# Patient Record
Sex: Female | Born: 1985 | Race: White | Hispanic: No | Marital: Married | Smoking: Never smoker
Health system: Southern US, Community
[De-identification: ages and names within clinical notes are randomized; demographics above are authoritative.]

## PROBLEM LIST (undated history)

## (undated) ENCOUNTER — Inpatient Hospital Stay (HOSPITAL_COMMUNITY): Payer: Self-pay

## (undated) DIAGNOSIS — O139 Gestational [pregnancy-induced] hypertension without significant proteinuria, unspecified trimester: Secondary | ICD-10-CM

## (undated) HISTORY — PX: TONSILLECTOMY: SUR1361

## (undated) HISTORY — PX: WISDOM TOOTH EXTRACTION: SHX21

## (undated) HISTORY — PX: BUNIONECTOMY: SHX129

## (undated) HISTORY — DX: Gestational (pregnancy-induced) hypertension without significant proteinuria, unspecified trimester: O13.9

---
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
## 2015-06-24 LAB — OB RESULTS CONSOLE HIV ANTIBODY (ROUTINE TESTING): HIV: NONREACTIVE

## 2015-06-24 LAB — OB RESULTS CONSOLE RPR: RPR: NONREACTIVE

## 2015-06-24 LAB — OB RESULTS CONSOLE GC/CHLAMYDIA
Chlamydia: NEGATIVE
Gonorrhea: NEGATIVE

## 2015-06-24 LAB — OB RESULTS CONSOLE ABO/RH: RH TYPE: POSITIVE

## 2015-06-24 LAB — OB RESULTS CONSOLE HEPATITIS B SURFACE ANTIGEN: Hepatitis B Surface Ag: NEGATIVE

## 2015-06-24 LAB — OB RESULTS CONSOLE RUBELLA ANTIBODY, IGM: Rubella: IMMUNE

## 2015-06-24 LAB — OB RESULTS CONSOLE ANTIBODY SCREEN: Antibody Screen: NEGATIVE

## 2015-12-31 LAB — OB RESULTS CONSOLE GBS: GBS: POSITIVE

## 2016-01-28 ENCOUNTER — Telehealth (HOSPITAL_COMMUNITY): Payer: Self-pay | Admitting: *Deleted

## 2016-01-28 ENCOUNTER — Encounter (HOSPITAL_COMMUNITY): Payer: Self-pay | Admitting: *Deleted

## 2016-01-28 NOTE — Telephone Encounter (Signed)
Preadmission screen  

## 2016-01-30 ENCOUNTER — Inpatient Hospital Stay (HOSPITAL_COMMUNITY): Payer: Self-pay

## 2016-01-31 NOTE — H&P (Signed)
Samantha Rogers is a 30 y.o. female presenting for post dates IOL. U/S in office on 01/27/16 noted EFW 7# 15 oz, vtx. History OB History    Gravida Para Term Preterm AB TAB SAB Ectopic Multiple Living   1              No past medical history on file. Past Surgical History  Procedure Laterality Date  . Tonsillectomy    . Wisdom tooth extraction     Family History: family history includes Bipolar disorder in her mother; Heart disease in her maternal grandmother; Hypertension in her father; Stroke in her maternal grandfather and paternal grandfather. Social History:  has no tobacco, alcohol, and drug history on file.   Prenatal Transfer Tool  Maternal Diabetes: No Genetic Screening: Normal Maternal Ultrasounds/Referrals: Normal Fetal Ultrasounds or other Referrals:  None Maternal Substance Abuse:  No Significant Maternal Medications:  None Significant Maternal Lab Results:  None Other Comments:  None  Review of Systems  Eyes: Negative for blurred vision.  Gastrointestinal: Negative for abdominal pain.  Neurological: Negative for headaches.      Last menstrual period 04/13/2015. Maternal Exam:  Abdomen: Fetal presentation: vertex     Physical Exam  Cardiovascular: Normal rate.   Respiratory: Effort normal.  GI: Soft.  Neurological: She has normal reflexes.    Prenatal labs: ABO, Rh: A/Positive/-- (11/21 0000) Antibody: Negative (11/21 0000) Rubella: Immune (11/21 0000) RPR: Nonreactive (11/21 0000)  HBsAg: Negative (11/21 0000)  HIV: Non-reactive (11/21 0000)  GBS: Positive (05/30 0000)   Assessment/Plan: 30 yo G1P0 @ [redacted] wks EGA for 2 stage induction Risks reviewed with patient ATB for GBBS prophylaxis   Emir Nack II,Junette Bernat E 01/31/2016, 1:11 PM

## 2016-02-03 ENCOUNTER — Inpatient Hospital Stay (HOSPITAL_COMMUNITY)
Admission: RE | Admit: 2016-02-03 | Discharge: 2016-02-03 | Disposition: A | Discharge status: Home / Self Care | Payer: BLUE CROSS/BLUE SHIELD | Source: Ambulatory Visit | Attending: Obstetrics and Gynecology | Admitting: Obstetrics and Gynecology

## 2016-02-03 ENCOUNTER — Inpatient Hospital Stay (HOSPITAL_COMMUNITY)
Admission: AD | Admit: 2016-02-03 | Discharge: 2016-02-06 | DRG: 775 | Disposition: A | Discharge status: Home / Self Care | Payer: BLUE CROSS/BLUE SHIELD | Source: Ambulatory Visit | Attending: Obstetrics and Gynecology | Admitting: Obstetrics and Gynecology

## 2016-02-03 ENCOUNTER — Inpatient Hospital Stay (HOSPITAL_COMMUNITY): Payer: BLUE CROSS/BLUE SHIELD | Admitting: Anesthesiology

## 2016-02-03 ENCOUNTER — Encounter (HOSPITAL_COMMUNITY): Payer: Self-pay

## 2016-02-03 DIAGNOSIS — O480 Post-term pregnancy: Secondary | ICD-10-CM | POA: Diagnosis present

## 2016-02-03 DIAGNOSIS — Z3A41 41 weeks gestation of pregnancy: Secondary | ICD-10-CM

## 2016-02-03 DIAGNOSIS — O1494 Unspecified pre-eclampsia, complicating childbirth: Secondary | ICD-10-CM | POA: Diagnosis present

## 2016-02-03 DIAGNOSIS — R51 Headache: Secondary | ICD-10-CM | POA: Diagnosis present

## 2016-02-03 LAB — CBC
HCT: 38.3 % (ref 36.0–46.0)
HEMATOCRIT: 37.5 % (ref 36.0–46.0)
Hemoglobin: 13.3 g/dL (ref 12.0–15.0)
Hemoglobin: 13.2 g/dL (ref 12.0–15.0)
MCH: 30.9 pg (ref 26.0–34.0)
MCH: 31.2 pg (ref 26.0–34.0)
MCHC: 34.7 g/dL (ref 30.0–36.0)
MCHC: 35.2 g/dL (ref 30.0–36.0)
MCV: 89.1 fL (ref 78.0–100.0)
MCV: 88.7 fL (ref 78.0–100.0)
PLATELETS: 159 10*3/uL (ref 150–400)
PLATELETS: 166 10*3/uL (ref 150–400)
RBC: 4.3 MIL/uL (ref 3.87–5.11)
RBC: 4.23 MIL/uL (ref 3.87–5.11)
RDW: 13.4 % (ref 11.5–15.5)
RDW: 13.2 % (ref 11.5–15.5)
WBC: 6.8 10*3/uL (ref 4.0–10.5)
WBC: 10.8 10*3/uL — AB (ref 4.0–10.5)

## 2016-02-03 LAB — COMPREHENSIVE METABOLIC PANEL
ALK PHOS: 161 U/L — AB (ref 38–126)
ALT: 22 U/L (ref 14–54)
AST: 25 U/L (ref 15–41)
Albumin: 2.9 g/dL — ABNORMAL LOW (ref 3.5–5.0)
Anion gap: 8 (ref 5–15)
BUN: 11 mg/dL (ref 6–20)
CALCIUM: 9.3 mg/dL (ref 8.9–10.3)
CO2: 20 mmol/L — AB (ref 22–32)
CREATININE: 0.7 mg/dL (ref 0.44–1.00)
Chloride: 106 mmol/L (ref 101–111)
GFR calc non Af Amer: >60 mL/min (ref >60)
GLUCOSE: 110 mg/dL — AB (ref 65–99)
Potassium: 3.9 mmol/L (ref 3.5–5.1)
SODIUM: 134 mmol/L — AB (ref 135–145)
Total Bilirubin: 0.3 mg/dL (ref 0.3–1.2)
Total Protein: 5.2 g/dL — ABNORMAL LOW (ref 6.5–8.1)

## 2016-02-03 LAB — URIC ACID: Uric Acid, Serum: 6.5 mg/dL (ref 2.3–6.6)

## 2016-02-03 LAB — TYPE AND SCREEN
ABO/RH(D): A POS
Antibody Screen: NEGATIVE

## 2016-02-03 LAB — ABO/RH: ABO/RH(D): A POS

## 2016-02-03 LAB — OB RESULTS CONSOLE GBS: GBS: POSITIVE

## 2016-02-03 LAB — OB RESULTS CONSOLE GC/CHLAMYDIA: Chlamydia: NEGATIVE

## 2016-02-03 MED ORDER — LACTATED RINGERS IV SOLN: 500.0000 mL | INTRAVENOUS | Status: DC | PRN

## 2016-02-03 MED ORDER — ZOLPIDEM TARTRATE 5 MG PO TABS: 5.0000 mg | ORAL_TABLET | Freq: Every evening | ORAL | Status: DC | PRN

## 2016-02-03 MED ORDER — BUTORPHANOL TARTRATE 1 MG/ML IJ SOLN: 1.0000 mg | INTRAMUSCULAR | Status: DC | PRN

## 2016-02-03 MED ORDER — MAGNESIUM SULFATE BOLUS VIA INFUSION
4.0000 g | Freq: Once | INTRAVENOUS | Status: AC
  Administered 2016-02-03: 4 g via INTRAVENOUS
  Filled 2016-02-03: qty 500

## 2016-02-03 MED ORDER — ONDANSETRON HCL 4 MG/2ML IJ SOLN
4.0000 mg | Freq: Four times a day (QID) | INTRAMUSCULAR | Status: DC | PRN
  Administered 2016-02-03: 4 mg via INTRAVENOUS
  Filled 2016-02-03: qty 2

## 2016-02-03 MED ORDER — LIDOCAINE HCL (PF) 1 % IJ SOLN
30.0000 mL | INTRAMUSCULAR | Status: AC | PRN
  Administered 2016-02-04: 30 mL via SUBCUTANEOUS
  Filled 2016-02-03: qty 30

## 2016-02-03 MED ORDER — FLEET ENEMA 7-19 GM/118ML RE ENEM
1.0000 | ENEMA | RECTAL | Status: DC | PRN
Start: 2016-02-03 — End: 2016-02-04

## 2016-02-03 MED ORDER — PHENYLEPHRINE 40 MCG/ML (10ML) SYRINGE FOR IV PUSH (FOR BLOOD PRESSURE SUPPORT)
80.0000 ug | PREFILLED_SYRINGE | INTRAVENOUS | Status: DC | PRN
  Filled 2016-02-03: qty 5
  Filled 2016-02-03: qty 10

## 2016-02-03 MED ORDER — DEXTROSE 5 % IV SOLN
5.0000 10*6.[IU] | Freq: Once | INTRAVENOUS | Status: AC
  Administered 2016-02-03: 5 10*6.[IU] via INTRAVENOUS
  Filled 2016-02-03: qty 5

## 2016-02-03 MED ORDER — SOD CITRATE-CITRIC ACID 500-334 MG/5ML PO SOLN: 30.0000 mL | ORAL | Status: DC | PRN

## 2016-02-03 MED ORDER — FENTANYL 2.5 MCG/ML BUPIVACAINE 1/10 % EPIDURAL INFUSION (WH - ANES)
14.0000 mL/h | INTRAMUSCULAR | Status: DC | PRN
  Administered 2016-02-03 (×2): 14 mL/h via EPIDURAL
  Filled 2016-02-03: qty 125

## 2016-02-03 MED ORDER — EPHEDRINE 5 MG/ML INJ
10.0000 mg | INTRAVENOUS | Status: DC | PRN
  Filled 2016-02-03: qty 2

## 2016-02-03 MED ORDER — LACTATED RINGERS IV SOLN
500.0000 mL | Freq: Once | INTRAVENOUS | Status: DC
Start: 2016-02-03 — End: 2016-02-04

## 2016-02-03 MED ORDER — OXYCODONE-ACETAMINOPHEN 5-325 MG PO TABS: 2.0000 | ORAL_TABLET | ORAL | Status: DC | PRN

## 2016-02-03 MED ORDER — OXYCODONE-ACETAMINOPHEN 5-325 MG PO TABS: 1.0000 | ORAL_TABLET | ORAL | Status: DC | PRN

## 2016-02-03 MED ORDER — ACETAMINOPHEN 325 MG PO TABS: 650.0000 mg | ORAL_TABLET | ORAL | Status: DC | PRN

## 2016-02-03 MED ORDER — MAGNESIUM SULFATE 50 % IJ SOLN
2.0000 g/h | INTRAVENOUS | Status: DC
  Filled 2016-02-03: qty 80

## 2016-02-03 MED ORDER — LIDOCAINE HCL (PF) 1 % IJ SOLN
INTRAMUSCULAR | Status: DC | PRN
  Administered 2016-02-03 (×2): 5 mL

## 2016-02-03 MED ORDER — PENICILLIN G POTASSIUM 5000000 UNITS IJ SOLR
2.5000 10*6.[IU] | INTRAVENOUS | Status: DC
  Administered 2016-02-03 – 2016-02-04 (×3): 2.5 10*6.[IU] via INTRAVENOUS
  Filled 2016-02-03 (×7): qty 2.5

## 2016-02-03 MED ORDER — TERBUTALINE SULFATE 1 MG/ML IJ SOLN: 0.2500 mg | Freq: Once | INTRAMUSCULAR | Status: DC | PRN

## 2016-02-03 MED ORDER — LACTATED RINGERS IV SOLN
INTRAVENOUS | Status: DC
  Administered 2016-02-03: 100 mL/h via INTRAVENOUS

## 2016-02-03 MED ORDER — DIPHENHYDRAMINE HCL 50 MG/ML IJ SOLN: 12.5000 mg | INTRAMUSCULAR | Status: DC | PRN

## 2016-02-03 MED ORDER — MISOPROSTOL 25 MCG QUARTER TABLET
25.0000 ug | ORAL_TABLET | ORAL | Status: DC | PRN
  Administered 2016-02-03 (×2): 25 ug via VAGINAL
  Filled 2016-02-03 (×3): qty 0.25
  Filled 2016-02-03: qty 1

## 2016-02-03 MED ORDER — OXYTOCIN 40 UNITS IN LACTATED RINGERS INFUSION - SIMPLE MED
2.5000 [IU]/h | INTRAVENOUS | Status: DC
  Filled 2016-02-03: qty 1000

## 2016-02-03 MED ORDER — PHENYLEPHRINE 40 MCG/ML (10ML) SYRINGE FOR IV PUSH (FOR BLOOD PRESSURE SUPPORT)
80.0000 ug | PREFILLED_SYRINGE | INTRAVENOUS | Status: DC | PRN
  Filled 2016-02-03: qty 5

## 2016-02-03 MED ORDER — OXYTOCIN BOLUS FROM INFUSION
500.0000 mL | INTRAVENOUS | Status: DC
  Administered 2016-02-04: 500 mL via INTRAVENOUS

## 2016-02-03 NOTE — Anesthesia Preprocedure Evaluation (Signed)
Anesthesia Evaluation  Patient identified by MRN, date of birth, ID band Patient awake    Reviewed: Allergy & Precautions, H&P , NPO status , Patient's Chart, lab work & pertinent test results  History of Anesthesia Complications Negative for: history of anesthetic complications  Airway Mallampati: II  TM Distance: >3 FB Neck ROM: full    Dental no notable dental hx. (+) Teeth Intact   Pulmonary neg pulmonary ROS,    Pulmonary exam normal breath sounds clear to auscultation       Cardiovascular hypertension (on magnesium), Normal cardiovascular exam Rhythm:regular Rate:Normal     Neuro/Psych negative neurological ROS  negative psych ROS   GI/Hepatic negative GI ROS, Neg liver ROS,   Endo/Other  negative endocrine ROS  Renal/GU negative Renal ROS  negative genitourinary   Musculoskeletal negative musculoskeletal ROS (+)   Abdominal   Peds negative pediatric ROS (+)  Hematology negative hematology ROS (+)   Anesthesia Other Findings   Reproductive/Obstetrics (+) Pregnancy                             Anesthesia Physical Anesthesia Plan  ASA: II  Anesthesia Plan: Epidural   Post-op Pain Management:    Induction:   Airway Management Planned:   Additional Equipment:   Intra-op Plan:   Post-operative Plan:   Informed Consent: I have reviewed the patients History and Physical, chart, labs and discussed the procedure including the risks, benefits and alternatives for the proposed anesthesia with the patient or authorized representative who has indicated his/her understanding and acceptance.     Plan Discussed with:   Anesthesia Plan Comments:         Anesthesia Quick Evaluation

## 2016-02-03 NOTE — Anesthesia Pain Management Evaluation Note (Signed)
  CRNA Pain Management Visit Note  Patient: Samantha Rogers, 30 y.o., female  "Hello I am a member of the anesthesia team at [facility]. We have an anesthesia team available at all times to provide care throughout the hospital, including epidural management and anesthesia for C-section. I don't know your plan for the delivery whether it a natural birth, water birth, IV sedation, nitrous supplementation, doula or epidural, but we want to meet your pain goals."   1.Was your pain managed to your expectations on prior hospitalizations?     2.What is your expectation for pain management during this hospitalization?      3.How can we help you reach that goal?   Record the patient's initial score and the patient's pain goal.   Pain: 0  Pain Goal: 5 [facility] wants you to be able to say your pain was always managed very well.  Samantha Rogers,Samantha Rogers 02/03/2016

## 2016-02-03 NOTE — Progress Notes (Signed)
Patient decided to not do induction this am. Instead, she presented to office with C/O mild HA this am. BP in office 150/100, protein=negative. No HA now. Sent to L&D for two stage induction.  Filed Vitals:   02/03/16 1326 02/03/16 1332  BP: 133/88 141/91  Pulse: 90 86  Temp:    Resp: 16 16   No epigastric tenderness DTR in office 2+ without clonus  FHT cat one  Results for orders placed or performed during the hospital encounter of 02/03/16 (from the past 24 hour(s))  Comprehensive metabolic panel     Status: Abnormal   Collection Time: 02/03/16 12:50 PM  Result Value Ref Range   Sodium 134 (L) 135 - 145 mmol/L   Potassium 3.9 3.5 - 5.1 mmol/L   Chloride 106 101 - 111 mmol/L   CO2 20 (L) 22 - 32 mmol/L   Glucose, Bld 110 (H) 65 - 99 mg/dL   BUN 11 6 - 20 mg/dL   Creatinine, Ser 1.190.70 0.44 - 1.00 mg/dL   Calcium 9.3 8.9 - 14.710.3 mg/dL   Total Protein 5.2 (L) 6.5 - 8.1 g/dL   Albumin 2.9 (L) 3.5 - 5.0 g/dL   AST 25 15 - 41 U/L   ALT 22 14 - 54 U/L   Alkaline Phosphatase 161 (H) 38 - 126 U/L   Total Bilirubin 0.3 0.3 - 1.2 mg/dL   GFR calc non Af Amer >60 >60 mL/min   GFR calc Af Amer >60 >60 mL/min   Anion gap 8 5 - 15  Uric acid     Status: None   Collection Time: 02/03/16 12:50 PM  Result Value Ref Range   Uric Acid, Serum 6.5 2.3 - 6.6 mg/dL  CBC     Status: None   Collection Time: 02/03/16 12:50 PM  Result Value Ref Range   WBC 6.8 4.0 - 10.5 K/uL   RBC 4.30 3.87 - 5.11 MIL/uL   Hemoglobin 13.3 12.0 - 15.0 g/dL   HCT 82.938.3 56.236.0 - 13.046.0 %   MCV 89.1 78.0 - 100.0 fL   MCH 30.9 26.0 - 34.0 pg   MCHC 34.7 30.0 - 36.0 g/dL   RDW 86.513.4 78.411.5 - 69.615.5 %   Platelets 159 150 - 400 K/uL  Type and [facility] [place]     Status: None   Collection Time: 02/03/16 12:50 PM  Result Value Ref Range   ABO/RH(D) A POS    Antibody Screen NEG    Sample Expiration 02/06/2016    A/P: preeclampsia         D/W patient and husband-magnesium sulfate for sz prophylaxis,  two stage induction-risks reviewed         Patient states she understands and agrees         Repeat labs in am

## 2016-02-03 NOTE — Anesthesia Procedure Notes (Signed)
Epidural Patient location during procedure: OB  Staffing Anesthesiologist: Marshel Golubski Performed by: anesthesiologist   Preanesthetic Checklist Completed: patient identified, site marked, surgical consent, pre-op evaluation, timeout performed, IV checked, risks and benefits discussed and monitors and equipment checked  Epidural Patient position: sitting Prep: DuraPrep Patient monitoring: heart rate, continuous pulse ox and blood pressure Approach: right paramedian Location: L3-L4 Injection technique: LOR saline  Needle:  Needle type: Tuohy  Needle gauge: 17 G Needle length: 9 cm and 9 Needle insertion depth: 6 cm Catheter type: closed end flexible Catheter size: 20 Guage Catheter at skin depth: 10 cm Test dose: negative  Assessment Events: blood not aspirated, injection not painful, no injection resistance, negative IV test and no paresthesia  Additional Notes Patient identified. Risks/Benefits/Options discussed with patient including but not limited to bleeding, infection, nerve damage, paralysis, failed block, incomplete pain control, headache, blood pressure changes, nausea, vomiting, reactions to medication both or allergic, itching and postpartum back pain. Confirmed with bedside nurse the patient's most recent platelet count. Confirmed with patient that they are not currently taking any anticoagulation, have any bleeding history or any family history of bleeding disorders. Patient expressed understanding and wished to proceed. All questions were answered. Sterile technique was used throughout the entire procedure. Please see nursing notes for vital signs. Test dose was given through epidural needle and negative prior to continuing to dose epidural or start infusion. Warning signs of high block given to the patient including shortness of breath, tingling/numbness in hands, complete motor block, or any concerning symptoms with instructions to call for help. Patient was given  instructions on fall risk and not to get out of bed. All questions and concerns addressed with instructions to call with any issues.   

## 2016-02-04 ENCOUNTER — Encounter (HOSPITAL_COMMUNITY): Payer: Self-pay

## 2016-02-04 LAB — COMPREHENSIVE METABOLIC PANEL
ALK PHOS: 157 U/L — AB (ref 38–126)
ALT: 30 U/L (ref 14–54)
AST: 43 U/L — AB (ref 15–41)
Albumin: 2.8 g/dL — ABNORMAL LOW (ref 3.5–5.0)
Anion gap: 9 (ref 5–15)
BILIRUBIN TOTAL: 0.4 mg/dL (ref 0.3–1.2)
BUN: 9 mg/dL (ref 6–20)
CALCIUM: 7.8 mg/dL — AB (ref 8.9–10.3)
CHLORIDE: 104 mmol/L (ref 101–111)
CO2: 19 mmol/L — ABNORMAL LOW (ref 22–32)
CREATININE: 0.85 mg/dL (ref 0.44–1.00)
Glucose, Bld: 99 mg/dL (ref 65–99)
Potassium: 4.2 mmol/L (ref 3.5–5.1)
Sodium: 132 mmol/L — ABNORMAL LOW (ref 135–145)
TOTAL PROTEIN: 5.5 g/dL — AB (ref 6.5–8.1)

## 2016-02-04 LAB — CBC
HEMATOCRIT: 39.6 % (ref 36.0–46.0)
Hemoglobin: 14 g/dL (ref 12.0–15.0)
MCH: 31.1 pg (ref 26.0–34.0)
MCHC: 35.4 g/dL (ref 30.0–36.0)
MCV: 88 fL (ref 78.0–100.0)
PLATELETS: 174 10*3/uL (ref 150–400)
RBC: 4.5 MIL/uL (ref 3.87–5.11)
RDW: 13.1 % (ref 11.5–15.5)
WBC: 16 10*3/uL — AB (ref 4.0–10.5)

## 2016-02-04 LAB — URIC ACID: URIC ACID, SERUM: 6.3 mg/dL (ref 2.3–6.6)

## 2016-02-04 LAB — RPR: RPR Ser Ql: NONREACTIVE

## 2016-02-04 MED ORDER — DIPHENHYDRAMINE HCL 25 MG PO CAPS: 25.0000 mg | ORAL_CAPSULE | Freq: Four times a day (QID) | ORAL | Status: DC | PRN

## 2016-02-04 MED ORDER — TETANUS-DIPHTH-ACELL PERTUSSIS 5-2.5-18.5 LF-MCG/0.5 IM SUSP
0.5000 mL | Freq: Once | INTRAMUSCULAR | Status: DC
  Filled 2016-02-04: qty 0.5

## 2016-02-04 MED ORDER — SENNOSIDES-DOCUSATE SODIUM 8.6-50 MG PO TABS
2.0000 | ORAL_TABLET | ORAL | Status: DC
  Administered 2016-02-05: 1 via ORAL
  Administered 2016-02-05: 2 via ORAL
  Filled 2016-02-04 (×2): qty 2

## 2016-02-04 MED ORDER — DIBUCAINE 1 % RE OINT: 1.0000 "application " | TOPICAL_OINTMENT | RECTAL | Status: DC | PRN

## 2016-02-04 MED ORDER — PRENATAL MULTIVITAMIN CH
1.0000 | ORAL_TABLET | Freq: Every day | ORAL | Status: DC
  Administered 2016-02-04 – 2016-02-06 (×2): 1 via ORAL
  Filled 2016-02-04 (×3): qty 1

## 2016-02-04 MED ORDER — OXYCODONE HCL 5 MG PO TABS: 10.0000 mg | ORAL_TABLET | ORAL | Status: DC | PRN

## 2016-02-04 MED ORDER — MAGNESIUM HYDROXIDE 400 MG/5ML PO SUSP: 30.0000 mL | ORAL | Status: DC | PRN

## 2016-02-04 MED ORDER — MAGNESIUM SULFATE 50 % IJ SOLN
2.0000 g/h | INTRAVENOUS | Status: DC
  Filled 2016-02-04: qty 80

## 2016-02-04 MED ORDER — ACETAMINOPHEN 325 MG PO TABS
650.0000 mg | ORAL_TABLET | ORAL | Status: DC | PRN
  Administered 2016-02-04: 650 mg via ORAL
  Filled 2016-02-04: qty 2

## 2016-02-04 MED ORDER — OXYCODONE HCL 5 MG PO TABS: 5.0000 mg | ORAL_TABLET | ORAL | Status: DC | PRN

## 2016-02-04 MED ORDER — LACTATED RINGERS IV SOLN
INTRAVENOUS | Status: DC
  Administered 2016-02-04: 12:00:00 via INTRAVENOUS

## 2016-02-04 MED ORDER — COCONUT OIL OIL
1.0000 "application " | TOPICAL_OIL | Status: DC | PRN
  Filled 2016-02-04: qty 120

## 2016-02-04 MED ORDER — WITCH HAZEL-GLYCERIN EX PADS: 1.0000 "application " | MEDICATED_PAD | CUTANEOUS | Status: DC | PRN

## 2016-02-04 MED ORDER — ONDANSETRON HCL 4 MG/2ML IJ SOLN: 4.0000 mg | INTRAMUSCULAR | Status: DC | PRN

## 2016-02-04 MED ORDER — ONDANSETRON HCL 4 MG PO TABS: 4.0000 mg | ORAL_TABLET | ORAL | Status: DC | PRN

## 2016-02-04 MED ORDER — BENZOCAINE-MENTHOL 20-0.5 % EX AERO
1.0000 "application " | INHALATION_SPRAY | CUTANEOUS | Status: DC | PRN
  Administered 2016-02-04: 1 via TOPICAL
  Filled 2016-02-04: qty 56

## 2016-02-04 MED ORDER — ZOLPIDEM TARTRATE 5 MG PO TABS: 5.0000 mg | ORAL_TABLET | Freq: Every evening | ORAL | Status: DC | PRN

## 2016-02-04 MED ORDER — IBUPROFEN 600 MG PO TABS
600.0000 mg | ORAL_TABLET | Freq: Four times a day (QID) | ORAL | Status: DC
  Administered 2016-02-04 – 2016-02-06 (×8): 600 mg via ORAL
  Filled 2016-02-04 (×8): qty 1

## 2016-02-04 MED ORDER — SIMETHICONE 80 MG PO CHEW: 80.0000 mg | CHEWABLE_TABLET | ORAL | Status: DC | PRN

## 2016-02-04 MED ORDER — MAGNESIUM SULFATE 50 % IJ SOLN
2.0000 g/h | INTRAVENOUS | Status: DC
  Administered 2016-02-04 – 2016-02-05 (×2): 2 g/h via INTRAVENOUS
  Filled 2016-02-04 (×2): qty 80

## 2016-02-04 NOTE — Lactation Note (Signed)
This note was copied from a baby's chart. Lactation Consultation Note  Baby is 10 hours of life and has been to the breast 4 times. Mom is concerned because she can not get the baby to latch to the left breast.  Attempted to latch her but she was sleepy. Areola is compressible and nipple is somewhat inverted possibly related to fluid.  Helped mom hand express and spoon feed a small amount to the baby. Explained to parents that if baby was not latching to the left side by 24 hours mom could begin pumping. For now she will hand express into a colostrum container and feed it to the baby as she desires. Patient Name: Samantha Linus OrnJenna Butt RUEAV'WToday's Date: 02/04/2016 Reason for consult: Initial assessment;Other (Comment) (mom on magnesium sulfate)   Maternal Data    Feeding Feeding Type: Breast Fed Length of feed: 0 min  LATCH Score/Interventions Latch: Too sleepy or reluctant, no latch achieved, no sucking elicited.  Audible Swallowing: None  Type of Nipple: Inverted Intervention(s):  (with compression,possibly fluid related)  Comfort (Breast/Nipple): Soft / non-tender     Hold (Positioning): No assistance needed to correctly position infant at breast.  LATCH Score: 4  Lactation Tools Discussed/Used     Consult Status Consult Status: Follow-up    Soyla DryerJoseph, Amena Dockham 02/04/2016, 3:20 PM

## 2016-02-04 NOTE — Progress Notes (Signed)
Operative Delivery Note At 4:36 AM a viable female was delivered via Vaginal, Spontaneous Delivery.  Presentation: vertex; Position: Right,, Occiput,, Anterior; Station: +3.  Verbal consent: obtained from patient.  Risks and benefits discussed in detail.  Risks include, but are not limited to the risks of anesthesia, bleeding, infection, damage to maternal tissues, fetal cephalhematoma.  There is also the risk of inability to effect vaginal delivery of the head, or shoulder dystocia that cannot be resolved by established maneuvers, leading to the need for emergency cesarean section. Pushing with good effort >2 hours.  APGAR: 7, 9; weight  .   Placenta status: Intact, Spontaneous.   Cord: 3 vessels with the following complications: None.  Cord pH: pending  Anesthesia: Epidural  Instruments: mushroom VE, no pop offs Episiotomy: second degree midline-repaired Lacerations:   Suture Repair: 2.0 vicryl Est. Blood Loss (mL):    Mom to postpartum.  Baby to Couplet care / Skin to Skin   PP magnesium sulfate Labs this am.  Samantha Rogers,[name] E 02/04/2016, 4:52 AM

## 2016-02-04 NOTE — Progress Notes (Signed)
Patient doing well. No headaches.  Bleeding and cramping normal.  BP 142/91 mmHg  Pulse 77  Temp(Src) 98.9 F (37.2 C) (Oral)  Resp 18  Ht 5\' 5"  (1.651 m)  Wt 81.194 kg (179 lb)  BMI 29.79 kg/m2  SpO2 99%  LMP 04/13/2015  Breastfeeding? Unknown Results for orders placed or performed during the hospital encounter of 02/03/16 (from the past 24 hour(s))  Comprehensive metabolic panel     Status: Abnormal   Collection Time: 02/03/16 12:50 PM  Result Value Ref Range   Sodium 134 (L) 135 - 145 mmol/L   Potassium 3.9 3.5 - 5.1 mmol/L   Chloride 106 101 - 111 mmol/L   CO2 20 (L) 22 - 32 mmol/L   Glucose, Bld 110 (H) 65 - 99 mg/dL   BUN 11 6 - 20 mg/dL   Creatinine, Ser 1.610.70 0.44 - 1.00 mg/dL   Calcium 9.3 8.9 - 09.610.3 mg/dL   Total Protein 5.2 (L) 6.5 - 8.1 g/dL   Albumin 2.9 (L) 3.5 - 5.0 g/dL   AST 25 15 - 41 U/L   ALT 22 14 - 54 U/L   Alkaline Phosphatase 161 (H) 38 - 126 U/L   Total Bilirubin 0.3 0.3 - 1.2 mg/dL   GFR calc non Af Amer >60 >60 mL/min   GFR calc Af Amer >60 >60 mL/min   Anion gap 8 5 - 15  Uric acid     Status: None   Collection Time: 02/03/16 12:50 PM  Result Value Ref Range   Uric Acid, Serum 6.5 2.3 - 6.6 mg/dL  CBC     Status: None   Collection Time: 02/03/16 12:50 PM  Result Value Ref Range   WBC 6.8 4.0 - 10.5 K/uL   RBC 4.30 3.87 - 5.11 MIL/uL   Hemoglobin 13.3 12.0 - 15.0 g/dL   HCT 04.538.3 40.936.0 - 81.146.0 %   MCV 89.1 78.0 - 100.0 fL   MCH 30.9 26.0 - 34.0 pg   MCHC 34.7 30.0 - 36.0 g/dL   RDW 91.413.4 78.211.5 - 95.615.5 %   Platelets 159 150 - 400 K/uL  RPR     Status: None   Collection Time: 02/03/16 12:50 PM  Result Value Ref Range   RPR Ser Ql Non Reactive Non Reactive  Type and screen [facility] [place]     Status: None   Collection Time: 02/03/16 12:50 PM  Result Value Ref Range   ABO/RH(D) A POS    Antibody Screen NEG    Sample Expiration 02/06/2016   ABO/Rh     Status: None   Collection Time: 02/03/16 12:50 PM  Result Value Ref Range    ABO/RH(D) A POS   OB RESULT CONSOLE Group B Strep     Status: None   Collection Time: 02/03/16 12:50 PM  Result Value Ref Range   GBS Positive   OB RESULTS CONSOLE GC/Chlamydia     Status: None   Collection Time: 02/03/16 12:50 PM  Result Value Ref Range   Chlamydia Negative   CBC     Status: Abnormal   Collection Time: 02/03/16 10:36 PM  Result Value Ref Range   WBC 10.8 (H) 4.0 - 10.5 K/uL   RBC 4.23 3.87 - 5.11 MIL/uL   Hemoglobin 13.2 12.0 - 15.0 g/dL   HCT 21.337.5 08.636.0 - 57.846.0 %   MCV 88.7 78.0 - 100.0 fL   MCH 31.2 26.0 - 34.0 pg   MCHC 35.2 30.0 - 36.0 g/dL  RDW 13.2 11.5 - 15.5 %   Platelets 166 150 - 400 K/uL  CBC     Status: Abnormal   Collection Time: 02/04/16  5:06 AM  Result Value Ref Range   WBC 16.0 (H) 4.0 - 10.5 K/uL   RBC 4.50 3.87 - 5.11 MIL/uL   Hemoglobin 14.0 12.0 - 15.0 g/dL   HCT 81.139.6 91.436.0 - 78.246.0 %   MCV 88.0 78.0 - 100.0 fL   MCH 31.1 26.0 - 34.0 pg   MCHC 35.4 30.0 - 36.0 g/dL   RDW 95.613.1 21.311.5 - 08.615.5 %   Platelets 174 150 - 400 K/uL  Comprehensive metabolic panel     Status: Abnormal   Collection Time: 02/04/16  5:06 AM  Result Value Ref Range   Sodium 132 (L) 135 - 145 mmol/L   Potassium 4.2 3.5 - 5.1 mmol/L   Chloride 104 101 - 111 mmol/L   CO2 19 (L) 22 - 32 mmol/L   Glucose, Bld 99 65 - 99 mg/dL   BUN 9 6 - 20 mg/dL   Creatinine, Ser 5.780.85 0.44 - 1.00 mg/dL   Calcium 7.8 (L) 8.9 - 10.3 mg/dL   Total Protein 5.5 (L) 6.5 - 8.1 g/dL   Albumin 2.8 (L) 3.5 - 5.0 g/dL   AST 43 (H) 15 - 41 U/L   ALT 30 14 - 54 U/L   Alkaline Phosphatase 157 (H) 38 - 126 U/L   Total Bilirubin 0.4 0.3 - 1.2 mg/dL   GFR calc non Af Amer >60 >60 mL/min   GFR calc Af Amer >60 >60 mL/min   Anion gap 9 5 - 15  Uric acid     Status: None   Collection Time: 02/04/16  5:06 AM  Result Value Ref Range   Uric Acid, Serum 6.3 2.3 - 6.6 mg/dL   IMPRESSION: PPD #0 Preeclampsia  PLAN: Continue Magnesium until tomorrow am Routine care

## 2016-02-04 NOTE — Anesthesia Postprocedure Evaluation (Signed)
Anesthesia Post Note  Patient: Academic librarianJenna Chakraborty  Procedure(s) Performed: * No procedures listed *  Patient location during evaluation: Antenatal Anesthesia Type: Epidural Level of consciousness: oriented and awake and alert Pain management: pain level controlled Vital Signs Assessment: post-procedure vital signs reviewed and stable Respiratory status: spontaneous breathing and nonlabored ventilation Cardiovascular status: stable Postop Assessment: epidural receding, patient able to bend at knees, no signs of nausea or vomiting and adequate PO intake Anesthetic complications: no     Last Vitals:  Filed Vitals:   02/04/16 1104 02/04/16 1130  BP: 133/85 138/93  Pulse: 78 70  Temp:    Resp:      Last Pain:  Filed Vitals:   02/04/16 1149  PainSc: 0-No pain   Pain Goal: Patients Stated Pain Goal: 2 (02/04/16 0615)               Laban EmperorMalinova,Anorah Trias Hristova

## 2016-02-05 LAB — COMPREHENSIVE METABOLIC PANEL
ALT: 31 U/L (ref 14–54)
AST: 38 U/L (ref 15–41)
Albumin: 2.6 g/dL — ABNORMAL LOW (ref 3.5–5.0)
Alkaline Phosphatase: 130 U/L — ABNORMAL HIGH (ref 38–126)
Anion gap: 6 (ref 5–15)
BILIRUBIN TOTAL: 0.1 mg/dL — AB (ref 0.3–1.2)
BUN: 10 mg/dL (ref 6–20)
CHLORIDE: 103 mmol/L (ref 101–111)
CO2: 24 mmol/L (ref 22–32)
CREATININE: 0.74 mg/dL (ref 0.44–1.00)
Calcium: 7 mg/dL — ABNORMAL LOW (ref 8.9–10.3)
Glucose, Bld: 80 mg/dL (ref 65–99)
POTASSIUM: 4.1 mmol/L (ref 3.5–5.1)
Sodium: 133 mmol/L — ABNORMAL LOW (ref 135–145)
TOTAL PROTEIN: 5.3 g/dL — AB (ref 6.5–8.1)

## 2016-02-05 LAB — CBC
HEMATOCRIT: 37.1 % (ref 36.0–46.0)
Hemoglobin: 12.6 g/dL (ref 12.0–15.0)
MCH: 30.7 pg (ref 26.0–34.0)
MCHC: 34 g/dL (ref 30.0–36.0)
MCV: 90.3 fL (ref 78.0–100.0)
PLATELETS: 160 10*3/uL (ref 150–400)
RBC: 4.11 MIL/uL (ref 3.87–5.11)
RDW: 13.9 % (ref 11.5–15.5)
WBC: 10.3 10*3/uL (ref 4.0–10.5)

## 2016-02-05 NOTE — Lactation Note (Signed)
This note was copied from a baby's chart. Lactation Consultation Note  Follow up visit made.  Baby is currently nursing very well on right breast.  Many swallows noted.  Mom states baby is improving with latching to the left breast.  Baby has been cluster feeding.  Answered questions and encouraged to call with concerns/assist prn.  Patient Name: Girl Linus OrnJenna Pruiett VHQIO'NToday's Date: 02/05/2016 Reason for consult: Follow-up assessment   Maternal Data    Feeding Feeding Type: Breast Fed Length of feed: 30 min  LATCH Score/Interventions Latch: Grasps breast easily, tongue down, lips flanged, rhythmical sucking. Intervention(s): Skin to skin  Audible Swallowing: Spontaneous and intermittent  Type of Nipple: Everted at rest and after stimulation  Comfort (Breast/Nipple): Soft / non-tender     Hold (Positioning): No assistance needed to correctly position infant at breast. Intervention(s): Breastfeeding basics reviewed;Support Pillows;Skin to skin  LATCH Score: 10  Lactation Tools Discussed/Used     Consult Status Consult Status: Follow-up Date: 02/06/16 Follow-up type: In-patient    Huston FoleyMOULDEN, Nicollette Wilhelmi S 02/05/2016, 2:01 PM

## 2016-02-05 NOTE — Progress Notes (Signed)
Post Partum Day 1 Subjective: no complaints, up ad lib, voiding and tolerating PO.  No HA, CP/SOB, RUQ pain, or vision change.  Objective: Blood pressure 120/78, pulse 65, temperature 98.2 F (36.8 C), temperature source Oral, resp. rate 18, height 5\' 5"  (1.651 m), weight 179 lb (81.194 kg), last menstrual period 04/13/2015, SpO2 98 %, unknown if currently breastfeeding.  Physical Exam:  General: alert, cooperative and appears stated age Lochia: appropriate Uterine Fundus: firm Incision: healing well DVT Evaluation: No evidence of DVT seen on physical exam. Negative Homan's sign. No cords or calf tenderness.   Recent Labs  02/03/16 2236 02/04/16 0506  HGB 13.2 14.0  HCT 37.5 39.6    Assessment/Plan: Plan for discharge tomorrow  Pre-eclampsia-s/p magnesium recovery; AM labs pending.  BP normal to mild range.     LOS: 2 days   Samantha Rogers 02/05/2016, 9:43 AM

## 2016-02-06 NOTE — Progress Notes (Signed)
Post Partum Day 2 Subjective: no complaints, up ad lib, voiding and tolerating PO  Objective: Blood pressure 137/88, pulse 70, temperature 97.9 F (36.6 C), temperature source Oral, resp. rate 18, height 5\' 5"  (1.651 m), weight 173 lb 1.6 oz (78.518 kg), last menstrual period 04/13/2015, SpO2 98 %, unknown if currently breastfeeding.  Physical Exam:  General: alert, cooperative, appears stated age and no distress Lochia: appropriate Uterine Fundus: firm Incision: healing well DVT Evaluation: No evidence of DVT seen on physical exam.   Recent Labs  02/04/16 0506 02/05/16 0945  HGB 14.0 12.6  HCT 39.6 37.1    Assessment/Plan: Discharge home and Breastfeeding   LOS: 3 days   Yovani Cogburn C 02/06/2016, 9:42 AM

## 2016-02-06 NOTE — Lactation Note (Signed)
This note was copied from a baby's chart. Lactation Consultation Note  Patient Name: Samantha Rogers UJWJX'BToday's Date: 02/06/2016 Reason for consult: Follow-up assessment Baby 54 hours old. Mom reports that nursing is going very. [facility] and [facility] BF. Enc mom to continue nursing with cues. Discussed engorgement prevention/treatment. Referred parents to Baby and Me booklet for EBM storage guidelines and number of diapers to expect by day of life. Mom aware of OP/BFSG and LC phone line assistance after D/C.   Maternal Data    Feeding    LATCH Score/Interventions                      Lactation Tools Discussed/Used     Consult Status Consult Status: Complete    Nancy NordmannWILLIARD, Alacia Rehmann 02/06/2016, 11:29 AM

## 2016-02-06 NOTE — Discharge Summary (Signed)
Obstetric Discharge Summary Reason for Admission: induction of labor Prenatal Procedures: none Intrapartum Procedures: vacuum Postpartum Procedures: none Complications-Operative and Postpartum: none, preeclampsia HEMOGLOBIN  Date Value Ref Range Status  02/05/2016 12.6 12.0 - 15.0 g/dL Final   HCT  Date Value Ref Range Status  02/05/2016 37.1 36.0 - 46.0 % Final    Physical Exam:  General: alert, cooperative, appears stated age and no distress Lochia: appropriate Uterine Fundus: firm Incision: healing well DVT Evaluation: No evidence of DVT seen on physical exam.  Discharge Diagnoses: Term Pregnancy-delivered and Preelampsia  Discharge Information: Date: 02/06/2016 Activity: pelvic rest Diet: routine Medications: None Condition: stable Instructions: refer to practice specific booklet Discharge to: home   Newborn Data: Live born female  Birth Weight: 7 lb 7.6 oz (3390 g) APGAR: 7, 9  Home with mother.  Kairyn Olmeda C 02/06/2016, 9:47 AM

## 2016-09-21 ENCOUNTER — Ambulatory Visit (INDEPENDENT_AMBULATORY_CARE_PROVIDER_SITE_OTHER): Payer: BLUE CROSS/BLUE SHIELD | Admitting: Orthopedic Surgery

## 2016-09-21 ENCOUNTER — Ambulatory Visit (INDEPENDENT_AMBULATORY_CARE_PROVIDER_SITE_OTHER): Payer: Self-pay

## 2016-09-21 ENCOUNTER — Encounter (INDEPENDENT_AMBULATORY_CARE_PROVIDER_SITE_OTHER): Payer: Self-pay | Admitting: Orthopedic Surgery

## 2016-09-21 ENCOUNTER — Encounter (INDEPENDENT_AMBULATORY_CARE_PROVIDER_SITE_OTHER): Payer: Self-pay

## 2016-09-21 VITALS — Ht 65.0 in | Wt 173.0 lb

## 2016-09-21 DIAGNOSIS — M21611 Bunion of right foot: Secondary | ICD-10-CM | POA: Insufficient documentation

## 2016-09-21 DIAGNOSIS — M21622 Bunionette of left foot: Secondary | ICD-10-CM

## 2016-09-21 NOTE — Progress Notes (Signed)
Office Visit Note   Patient: Samantha Rogers           Date of Birth: 02-28-1986           MRN: [id number] Visit Date: 09/21/2016              Requested by: No referring provider defined for this encounter. PCP: No primary care provider on file.   Assessment & Plan: Visit Diagnoses:  1. Bunion of right foot   2. Bunionette of left foot     Plan: Due to pain with activities of daily living patient would like to proceed with bunion surgery on the left foot. We'll plan for a chevron osteotomy the first metatarsal. Risk and benefits were discussed including infection neurovascular injury DVT need for additional surgery. Patient states she understands wish to proceed at this time.  Follow-Up Instructions: Return in about 1 week (around 09/28/2016).   Orders:  Orders Placed This Encounter  Procedures  . XR Foot 2 Views Left  . XR Foot 2 Views Right   No orders of the defined types were placed in this encounter.     Procedures: No procedures performed   Clinical Data: No additional findings.   Subjective: Chief Complaint  Patient presents with  . Right Foot - Pain    bunion  . Left Foot - Pain    bunion    Patient is here to have bilateral bunions evaluated. She states that the left is worse than the right. The deformity seems to be getting worse. She does have on and off discomfort.     Review of Systems   Objective: Vital Signs: Ht 5\' 5"  (1.651 m)   Wt 173 lb (78.5 kg)   BMI 28.79 kg/m   Physical Exam on examination patient is alert oriented no adenopathy well-dressed normal affect normal respiratory effort she does have a normal gait. She has a good dorsalis pedis and posterior tibial pulse. She has good range of motion of the ankle and subtalar joint. The MTP joint has good range of motion with no hallux rigidus. She has bony prominence medially with pain to palpation over the bony prominence.  Ortho Exam  Specialty Comments:  No specialty comments  available.  Imaging: Xr Foot 2 Views Left  Result Date: 09/21/2016 2 view radiographs of the left foot shows a congruent MTP joint with hallux valgus deformity moderate deformity. There are no bony spurs no subcondylar sclerosis or cysts.  Xr Foot 2 Views Right  Result Date: 09/21/2016 2 view radiographs of the right foot shows moderate bunion deformity of the right great toe MTP joint the joint is congruent there is no subcondylar sclerosis or cysts. No bony spurs.    PMFS History: Patient Active Problem List   Diagnosis Date Noted  . Bunion of right foot 09/21/2016  . Bunionette of left foot 09/21/2016  . Post-dates pregnancy 02/03/2016   History reviewed. No pertinent past medical history.  Family History  Problem Relation Age of Onset  . Stroke Maternal Grandfather   . Stroke Paternal Grandfather   . Bipolar disorder Mother   . Hypertension Father   . Heart disease Maternal Grandmother     Past Surgical History:  Procedure Laterality Date  . TONSILLECTOMY    . WISDOM TOOTH EXTRACTION     Social History   Occupational History  . Not on file.   Social History Main Topics  . Smoking status: Never Smoker  . Smokeless tobacco: Never Used  .  Alcohol use No  . Drug use: No  . Sexual activity: Yes

## 2016-10-20 ENCOUNTER — Encounter: Payer: Self-pay | Admitting: Orthopedic Surgery

## 2016-10-20 DIAGNOSIS — M2012 Hallux valgus (acquired), left foot: Secondary | ICD-10-CM | POA: Diagnosis not present

## 2016-10-27 ENCOUNTER — Ambulatory Visit (INDEPENDENT_AMBULATORY_CARE_PROVIDER_SITE_OTHER): Payer: BLUE CROSS/BLUE SHIELD | Admitting: Orthopedic Surgery

## 2016-10-27 ENCOUNTER — Encounter (INDEPENDENT_AMBULATORY_CARE_PROVIDER_SITE_OTHER): Payer: Self-pay | Admitting: Orthopedic Surgery

## 2016-10-27 VITALS — Ht 65.0 in | Wt 173.0 lb

## 2016-10-27 DIAGNOSIS — M21622 Bunionette of left foot: Secondary | ICD-10-CM

## 2016-10-27 NOTE — Progress Notes (Signed)
   Office Visit Note   Patient: Samantha Rogers           Date of Birth: April 27, 1986           MRN: [id number] Visit Date: 10/27/2016              Requested by: No referring provider defined for this encounter. PCP: [facility]  Chief Complaint  Patient presents with  . Left Foot - Routine Post Op    10/20/16 left foot bunionectomy with MT osteotomy    HPI: Patient is one-week status post chevron osteotomy left great toe. Patient is nonweightbearing with crutches postoperative shoe she feels well she has no concerns she has no complaints of pain and is not taking any medication.  Assessment & Plan: Visit Diagnoses:  1. Bunionette of left foot     Plan: Patient will start soap and water dressing changes Bactroban plus a dry dressing was applied. She will use over-the-counter antibiotic ointment to the incision and pin tract follow-up in the office in 1 week to harvest the sutures and remove the pin and start shape better moisturizing. Continue protected weightbearing.  Follow-Up Instructions: Return in about 1 week (around 11/03/2016).   Ortho Exam  Patient is alert, oriented, no adenopathy, well-dressed, normal affect, normal respiratory effort. Examination the incision is well approximated there is no redness no cellulitis no gaping of the wound no signs of infection there is no swelling  Imaging: No results found.  Labs: Lab Results  Component Value Date   LABURIC 6.3 02/04/2016   LABURIC 6.5 02/03/2016    Orders:  No orders of the defined types were placed in this encounter.  No orders of the defined types were placed in this encounter.    Procedures: No procedures performed  Clinical Data: No additional findings.  ROS:  All other systems negative, except as noted in the HPI. Review of Systems  Objective: Vital Signs: Ht 5\' 5"  (1.651 m)   Wt 173 lb (78.5 kg)   BMI 28.79 kg/m   Specialty Comments:  No specialty comments  available.  PMFS History: Patient Active Problem List   Diagnosis Date Noted  . Bunion of right foot 09/21/2016  . Bunionette of left foot 09/21/2016  . Post-dates pregnancy 02/03/2016   No past medical history on file.  Family History  Problem Relation Age of Onset  . Stroke Maternal Grandfather   . Stroke Paternal Grandfather   . Bipolar disorder Mother   . Hypertension Father   . Heart disease Maternal Grandmother     Past Surgical History:  Procedure Laterality Date  . TONSILLECTOMY    . WISDOM TOOTH EXTRACTION     Social History   Occupational History  . Not on file.   Social History Main Topics  . Smoking status: Never Smoker  . Smokeless tobacco: Never Used  . Alcohol use No  . Drug use: No  . Sexual activity: Yes

## 2016-11-04 ENCOUNTER — Ambulatory Visit (INDEPENDENT_AMBULATORY_CARE_PROVIDER_SITE_OTHER): Payer: BLUE CROSS/BLUE SHIELD | Admitting: Orthopedic Surgery

## 2016-11-04 DIAGNOSIS — M21611 Bunion of right foot: Secondary | ICD-10-CM

## 2016-11-04 NOTE — Progress Notes (Signed)
   Office Visit Note   Patient: Samantha Rogers           Date of Birth: 03-22-1986           MRN: [id number] Visit Date: 11/04/2016              Requested by: [facility] [address] [place], [place] [address] PCP: [facility]  No chief complaint on file.     HPI: The patient is a 31 year old woman who presents today status post bunionectomy with metatarsal osteotomy left foot. She is 2 weeks out. Has been working on elevation. He is nonweightbearing with crutches and a postop shoe today. No concerns voiced.  Assessment & Plan: Visit Diagnoses:  1. Bunion of right foot     Plan: Sutures and pin pulled today without incident. She will continue with elevation. The right she may advance weightbearing in the postop shoe. Daily dry dressing changes.  Follow-Up Instructions: Return in about 2 weeks (around 11/18/2016).   Ortho Exam  Patient is alert, oriented, no adenopathy, well-dressed, normal affect, normal respiratory effort. Incision is clean dry and intact. Minimal swelling.   Imaging: No results found.  Labs: Lab Results  Component Value Date   LABURIC 6.3 02/04/2016   LABURIC 6.5 02/03/2016    Orders:  No orders of the defined types were placed in this encounter.  No orders of the defined types were placed in this encounter.    Procedures: No procedures performed  Clinical Data: No additional findings.  ROS:  All other systems negative, except as noted in the HPI. Review of Systems  Constitutional: Negative for chills and fever.    Objective: Vital Signs: There were no vitals taken for this visit.  Specialty Comments:  No specialty comments available.  PMFS History: Patient Active Problem List   Diagnosis Date Noted  . Bunion of right foot 09/21/2016  . Bunionette of left foot 09/21/2016  . Post-dates pregnancy 02/03/2016   No past medical history on file.  Family History    Problem Relation Age of Onset  . Stroke Maternal Grandfather   . Stroke Paternal Grandfather   . Bipolar disorder Mother   . Hypertension Father   . Heart disease Maternal Grandmother     Past Surgical History:  Procedure Laterality Date  . TONSILLECTOMY    . WISDOM TOOTH EXTRACTION     Social History   Occupational History  . Not on file.   Social History Main Topics  . Smoking status: Never Smoker  . Smokeless tobacco: Never Used  . Alcohol use No  . Drug use: No  . Sexual activity: Yes

## 2016-11-18 ENCOUNTER — Ambulatory Visit (INDEPENDENT_AMBULATORY_CARE_PROVIDER_SITE_OTHER): Payer: BLUE CROSS/BLUE SHIELD | Admitting: Family

## 2016-11-18 ENCOUNTER — Encounter (INDEPENDENT_AMBULATORY_CARE_PROVIDER_SITE_OTHER): Payer: Self-pay | Admitting: Family

## 2016-11-18 ENCOUNTER — Ambulatory Visit (INDEPENDENT_AMBULATORY_CARE_PROVIDER_SITE_OTHER): Payer: BLUE CROSS/BLUE SHIELD | Admitting: Orthopedic Surgery

## 2016-11-18 VITALS — Ht 65.0 in | Wt 173.0 lb

## 2016-11-18 DIAGNOSIS — M21611 Bunion of right foot: Secondary | ICD-10-CM

## 2016-11-18 NOTE — Progress Notes (Signed)
   Office Visit Note   Patient: Samantha Rogers           Date of Birth: 1985-08-05           MRN: [id number] Visit Date: 11/18/2016              Requested by: [facility] [address] [place], [place] [address] PCP: [facility] Complaint  Patient presents with  . Right Foot - Follow-up    10/20/16 bunionectomy with MT osteotomy left foot. 4 weeks post op.      HPI: the patient is a 31 year old woman who presents today for weeks status post bunionectomy with metatarsal osteotomy left foot. She has returned to regular shoewear. Today she is in crocs and states these are the most comfortable shoes that she has. The only question she has is when she will be able to return to running. Has returned to work without restrictions.  Assessment & Plan: Visit Diagnoses:  1. Bunion of right foot     Plan: Discussed that stiff soled shoes may be most comfortable. May resume running in 4 more weeks.   Follow-Up Instructions: Return in about 4 weeks (around 12/16/2016), or if symptoms worsen or fail to improve.   Ortho Exam  Patient is alert, oriented, no adenopathy, well-dressed, normal affect, normal respiratory effort. Left foot incision is well-healed. There are no open areas noted drainage no redness or sign of infection does have moderate swelling of the MTP.  Imaging: No results found.  Labs: Lab Results  Component Value Date   LABURIC 6.3 02/04/2016   LABURIC 6.5 02/03/2016    Orders:  No orders of the defined types were placed in this encounter.  No orders of the defined types were placed in this encounter.    Procedures: No procedures performed  Clinical Data: No additional findings.  ROS:  All other systems negative, except as noted in the HPI. Review of Systems  Constitutional: Negative for chills and fever.  Musculoskeletal: Positive for arthralgias.    Objective: Vital Signs: Ht 5'  5" (1.651 m)   Wt 173 lb (78.5 kg)   BMI 28.79 kg/m   Specialty Comments:  No specialty comments available.  PMFS History: Patient Active Problem List   Diagnosis Date Noted  . Bunion of right foot 09/21/2016  . Bunionette of left foot 09/21/2016  . Post-dates pregnancy 02/03/2016   History reviewed. No pertinent past medical history.  Family History  Problem Relation Age of Onset  . Stroke Maternal Grandfather   . Stroke Paternal Grandfather   . Bipolar disorder Mother   . Hypertension Father   . Heart disease Maternal Grandmother     Past Surgical History:  Procedure Laterality Date  . TONSILLECTOMY    . WISDOM TOOTH EXTRACTION     Social History   Occupational History  . Not on file.   Social History Main Topics  . Smoking status: Never Smoker  . Smokeless tobacco: Never Used  . Alcohol use No  . Drug use: No  . Sexual activity: Yes

## 2017-07-28 LAB — OB RESULTS CONSOLE HIV ANTIBODY (ROUTINE TESTING): HIV: NONREACTIVE

## 2017-07-28 LAB — OB RESULTS CONSOLE GC/CHLAMYDIA
CHLAMYDIA, DNA PROBE: NEGATIVE
GC PROBE AMP, GENITAL: NEGATIVE

## 2017-07-28 LAB — OB RESULTS CONSOLE ABO/RH: RH Type: POSITIVE

## 2017-07-28 LAB — OB RESULTS CONSOLE ANTIBODY SCREEN: Antibody Screen: NEGATIVE

## 2017-07-28 LAB — OB RESULTS CONSOLE HEPATITIS B SURFACE ANTIGEN: Hepatitis B Surface Ag: NEGATIVE

## 2017-07-28 LAB — OB RESULTS CONSOLE RUBELLA ANTIBODY, IGM: Rubella: IMMUNE

## 2017-07-28 LAB — OB RESULTS CONSOLE RPR: RPR: NONREACTIVE

## 2017-08-22 ENCOUNTER — Inpatient Hospital Stay (HOSPITAL_COMMUNITY)
Admission: AD | Admit: 2017-08-22 | Discharge: 2017-08-22 | Disposition: A | Discharge status: Home / Self Care | Payer: BLUE CROSS/BLUE SHIELD | Source: Ambulatory Visit | Attending: Obstetrics and Gynecology | Admitting: Obstetrics and Gynecology

## 2017-08-22 ENCOUNTER — Other Ambulatory Visit: Payer: Self-pay

## 2017-08-22 ENCOUNTER — Inpatient Hospital Stay (HOSPITAL_COMMUNITY): Payer: BLUE CROSS/BLUE SHIELD

## 2017-08-22 ENCOUNTER — Encounter (HOSPITAL_COMMUNITY): Payer: Self-pay | Admitting: *Deleted

## 2017-08-22 DIAGNOSIS — O209 Hemorrhage in early pregnancy, unspecified: Secondary | ICD-10-CM | POA: Insufficient documentation

## 2017-08-22 DIAGNOSIS — O4691 Antepartum hemorrhage, unspecified, first trimester: Secondary | ICD-10-CM

## 2017-08-22 DIAGNOSIS — Z3A11 11 weeks gestation of pregnancy: Secondary | ICD-10-CM | POA: Diagnosis not present

## 2017-08-22 DIAGNOSIS — O4690 Antepartum hemorrhage, unspecified, unspecified trimester: Secondary | ICD-10-CM

## 2017-08-22 LAB — URINALYSIS, ROUTINE W REFLEX MICROSCOPIC
BILIRUBIN URINE: NEGATIVE
Bacteria, UA: NONE SEEN
GLUCOSE, UA: NEGATIVE mg/dL
KETONES UR: 5 mg/dL — AB
Leukocytes, UA: NEGATIVE
Nitrite: NEGATIVE
PROTEIN: NEGATIVE mg/dL
Specific Gravity, Urine: 1.011 (ref 1.005–1.030)
pH: 7 (ref 5.0–8.0)

## 2017-08-22 LAB — POCT PREGNANCY, URINE: Preg Test, Ur: POSITIVE — AB

## 2017-08-22 NOTE — MAU Provider Note (Signed)
History     CSN: [id number]  Arrival date and time: 08/22/17 4098   First Provider Initiated Contact with Patient 08/22/17 1026     Chief Complaint  Patient presents with  . Vaginal Bleeding   HPI Samantha Rogers is a 32 y.o. G2P1001 at [redacted]w[redacted]d who presents with vaginal bleeding. She states at 2100 she had a big gush of bright red bleeding that saturated a pad and some lower abdominal cramping. She states the cramping stopped but she had another big gush of bright red bleeding this am and passed a large clot. She states the bleeding has since slowed down to spotting. She denies any problems during the pregnancy until today.   OB History    Gravida Para Term Preterm AB Living   2 1 1     1    SAB TAB Ectopic Multiple Live Births         0 1      No past medical history on file.  Past Surgical History:  Procedure Laterality Date  . TONSILLECTOMY    . WISDOM TOOTH EXTRACTION      Family History  Problem Relation Age of Onset  . Stroke Maternal Grandfather   . Stroke Paternal Grandfather   . Bipolar disorder Mother   . Hypertension Father   . Heart disease Maternal Grandmother     Social History   Tobacco Use  . Smoking status: Never Smoker  . Smokeless tobacco: Never Used  Substance Use Topics  . Alcohol use: No  . Drug use: No    Allergies:  Allergies  Allergen Reactions  . Sulfa Antibiotics Swelling    eyes    Medications Prior to Admission  Medication Sig Dispense Refill Last Dose  . Biotin 1000 MCG tablet Take 1,000 mcg by mouth daily.   08/21/2017 at Unknown time  . guaiFENesin (MUCINEX) 600 MG 12 hr tablet Take 600 mg by mouth 2 (two) times daily as needed for cough or to loosen phlegm.   Past Week at Unknown time  . Prenatal Vit-Fe Fumarate-FA (PRENATAL MULTIVITAMIN) TABS tablet Take 1 tablet by mouth daily at 12 noon.   08/21/2017 at Unknown time    Review of Systems  Constitutional: Negative.  Negative for fatigue and fever.  HENT: Negative.    Respiratory: Negative.  Negative for shortness of breath.   Cardiovascular: Negative.  Negative for chest pain.  Gastrointestinal: Negative.  Negative for abdominal pain, constipation, diarrhea, nausea and vomiting.  Genitourinary: Positive for vaginal bleeding. Negative for dysuria.  Neurological: Negative.  Negative for dizziness and headaches.   Physical Exam   Blood pressure 124/79, pulse 79, temperature 98.9 F (37.2 C), temperature source Oral, resp. rate 17, height 5\' 5"  (1.651 m), weight 150 lb 0.8 oz (68.1 kg), unknown if currently breastfeeding.  Physical Exam  Nursing note and vitals reviewed. Constitutional: She is oriented to person, place, and time. She appears well-developed and well-nourished. No distress.  HENT:  Head: Normocephalic.  Eyes: Pupils are equal, round, and reactive to light.  Cardiovascular: Normal rate, regular rhythm and normal heart sounds.  Respiratory: Effort normal and breath sounds normal. No respiratory distress.  GI: Soft. Bowel sounds are normal. She exhibits no distension. There is no tenderness.  Genitourinary:  Genitourinary Comments: Small amount of bright red vaginal bleeding in vault.   Neurological: She is alert and oriented to person, place, and time.  Skin: Skin is warm and dry.  Psychiatric: She has a normal mood and affect. Her  behavior is normal. Judgment and thought content normal.   Dilation: Closed Exam by:: Druscilla BrownieNeill, CNM  FHT: 174 bpm  MAU Course  Procedures Results for orders placed or performed during the hospital encounter of 08/22/17 (from the past 24 hour(s))  Urinalysis, Routine w reflex microscopic     Status: Abnormal   Collection Time: 08/22/17 10:00 AM  Result Value Ref Range   Color, Urine YELLOW YELLOW   APPearance CLEAR CLEAR   Specific Gravity, Urine 1.011 1.005 - 1.030   pH 7.0 5.0 - 8.0   Glucose, UA NEGATIVE NEGATIVE mg/dL   Hgb urine dipstick MODERATE (A) NEGATIVE   Bilirubin Urine NEGATIVE NEGATIVE    Ketones, ur 5 (A) NEGATIVE mg/dL   Protein, ur NEGATIVE NEGATIVE mg/dL   Nitrite NEGATIVE NEGATIVE   Leukocytes, UA NEGATIVE NEGATIVE   RBC / HPF 0-5 0 - 5 RBC/hpf   WBC, UA 0-5 0 - 5 WBC/hpf   Bacteria, UA NONE SEEN NONE SEEN   Squamous Epithelial / LPF 0-5 (A) NONE SEEN   Mucus PRESENT   Pregnancy, urine POC     Status: Abnormal   Collection Time: 08/22/17 10:10 AM  Result Value Ref Range   Preg Test, Ur POSITIVE (A) NEGATIVE   [place] Ob Less Than 14 Weeks With Ob Transvaginal  Result Date: 08/22/2017 CLINICAL DATA:  Bleeding and cramping. EXAM: OBSTETRIC <14 WK US AND TRANSVAGINAL OB US TECHNIQUE: Both transabdominal and transvaginal ultrasound examinations were performed for complete evaluation of the gestation as well as the maternal uterus, adnexal regions, and pelvic cul-de-sac. Transvaginal technique was performed to assess early pregnancy. COMPARISON:  None. FINDINGS: Intrauterine gestational sac: Single Yolk sac:  Visualized. Embryo:  Visualized. Cardiac Activity: Visualized. Heart Rate: 174 bpm CRL:  49 mm   11 w   5 d                  US EDC: 03/08/2018 Subchorionic hemorrhage:  None visualized. Maternal uterus/adnexae: Normal appearance of bilateral ovarian tissue. No free fluid. IMPRESSION: Single live intrauterine pregnancy. Calculated gestational age by ultrasound is 11 weeks and 5 days. Electronically Signed   By: Richarda OverlieAdam  Henn M.D.   On: 08/22/2017 11:30   MDM UA US OB Comp <14 weeks  A Pos blood type Reviewed with Dr. Henderson Cloudomblin- ok to discharge patient home with bleeding precautions Assessment and Plan   1. Vaginal bleeding in pregnancy   2. [redacted] weeks gestation of pregnancy    -Discharge home in stable condition -Vaginal bleeding precautions discussed. Pelvic rest for next 7 days -Patient advised to follow-up with [facility] as scheduled for prenatal care -Patient may return to MAU as needed or if her condition were to change or worsen  Rolm BookbinderCaroline M Neill  CNM 08/22/2017, 11:35 AM

## 2017-08-22 NOTE — MAU Note (Signed)
Pt. Here due to vaginal bleeding with the passing of one large clot around 0630 this morning.  The first vaginal bleeding was around 2100 last night with no clots. Pt. Not currently actively bleeding.  FHR - 174 via doppler. Pain 0/10, mild cramping all during the night.

## 2017-08-22 NOTE — Discharge Instructions (Signed)
Pelvic Rest Pelvic rest may be recommended if:  Your placenta is partially or completely covering the opening of your cervix (placenta previa).  There is bleeding between the wall of the uterus and the amniotic sac in the first trimester of pregnancy (subchorionic hemorrhage).  You went into labor too early (preterm labor).  Based on your overall health and the health of your baby, your health care provider will decide if pelvic rest is right for you. How do I rest my pelvis? For as long as told by your health care provider:  Do not have sex, sexual stimulation, or an orgasm.  Do not use tampons. Do not douche. Do not put anything in your vagina.  Do not lift anything that is heavier than 10 lb (4.5 kg).  Avoid activities that take a lot of effort (are strenuous).  Avoid any activity in which your pelvic muscles could become strained.  When should I seek medical care? Seek medical care if you have:  Cramping pain in your lower abdomen.  Vaginal discharge.  A low, dull backache.  Regular contractions.  Uterine tightening.  When should I seek immediate medical care? Seek immediate medical care if:  You have vaginal bleeding and you are pregnant.  This information is not intended to replace advice given to you by your health care provider. Make sure you discuss any questions you have with your health care provider. Document Released: 11/14/2010 Document Revised: 12/26/2015 Document Reviewed: 01/21/2015 Elsevier Interactive Patient Education  2018 [employer]. Safe Medications in Pregnancy   Acne: Benzoyl Peroxide Salicylic Acid  Backache/Headache: Tylenol: 2 regular strength every 4 hours OR              2 Extra strength every 6 hours  Colds/Coughs/Allergies: Benadryl (alcohol free) 25 mg every 6 hours as needed Breath right strips Claritin Cepacol throat lozenges Chloraseptic throat spray Cold-Eeze- up to three times per day Cough drops, alcohol  free Flonase (by prescription only) Guaifenesin Mucinex Robitussin DM (plain only, alcohol free) Saline nasal spray/drops Sudafed (pseudoephedrine) & Actifed ** use only after [redacted] weeks gestation and if you do not have high blood pressure Tylenol Vicks Vaporub Zinc lozenges Zyrtec   Constipation: Colace Ducolax suppositories Fleet enema Glycerin suppositories Metamucil Milk of magnesia Miralax Senokot Smooth move tea  Diarrhea: Kaopectate Imodium A-D  *NO pepto Bismol  Hemorrhoids: Anusol Anusol HC Preparation H Tucks  Indigestion: Tums Maalox Mylanta Zantac  Pepcid  Insomnia: Benadryl (alcohol free) 25mg  every 6 hours as needed Tylenol PM Unisom, no Gelcaps  Leg Cramps: Tums MagGel  Nausea/Vomiting:  Bonine Dramamine Emetrol Ginger extract Sea bands Meclizine  Nausea medication to take during pregnancy:  Unisom (doxylamine succinate 25 mg tablets) Take one tablet daily at bedtime. If symptoms are not adequately controlled, the dose can be increased to a maximum recommended dose of two tablets daily (1/2 tablet in the morning, 1/2 tablet mid-afternoon and one at bedtime). Vitamin B6 100mg  tablets. Take one tablet twice a day (up to 200 mg per day).  Skin Rashes: Aveeno products Benadryl cream or 25mg  every 6 hours as needed Calamine Lotion 1% cortisone cream  Yeast infection: Gyne-lotrimin 7 Monistat 7   **If taking multiple medications, please check labels to avoid duplicating the same active ingredients **take medication as directed on the label ** Do not exceed 4000 mg of tylenol in 24 hours **Do not take medications that contain aspirin or ibuprofen     Vaginal Bleeding During Pregnancy, First Trimester A small amount  of bleeding (spotting) from the vagina is common in early pregnancy. Sometimes the bleeding is normal and is not a problem, and sometimes it is a sign of something serious. Be sure to tell your doctor about any  bleeding from your vagina right away. Follow these instructions at home:  Watch your condition for any changes.  Follow your doctor's instructions about how active you can be.  If you are on bed rest: ? You may need to stay in bed and only get up to use the bathroom. ? You may be allowed to do some activities. ? If you need help, make plans for someone to help you.  Write down: ? The number of pads you use each day. ? How often you change pads. ? How soaked (saturated) your pads are.  Do not use tampons.  Do not douche.  Do not have sex or orgasms until your doctor says it is okay.  If you pass any tissue from your vagina, save the tissue so you can show it to your doctor.  Only take medicines as told by your doctor.  Do not take aspirin because it can make you bleed.  Keep all follow-up visits as told by your doctor. Contact a doctor if:  You bleed from your vagina.  You have cramps.  You have labor pains.  You have a fever that does not go away after you take medicine. Get help right away if:  You have very bad cramps in your back or belly (abdomen).  You pass large clots or tissue from your vagina.  You bleed more.  You feel light-headed or weak.  You pass out (faint).  You have chills.  You are leaking fluid or have a gush of fluid from your vagina.  You pass out while pooping (having a bowel movement). This information is not intended to replace advice given to you by your health care provider. Make sure you discuss any questions you have with your health care provider. Document Released: 12/04/2013 Document Revised: 12/26/2015 Document Reviewed: 03/27/2013 Elsevier Interactive Patient Education  [employer]2018 Elsevier Inc.

## 2018-03-04 ENCOUNTER — Encounter (HOSPITAL_COMMUNITY): Payer: Self-pay | Admitting: *Deleted

## 2018-03-04 ENCOUNTER — Telehealth (HOSPITAL_COMMUNITY): Payer: Self-pay | Admitting: *Deleted

## 2018-03-04 NOTE — Telephone Encounter (Signed)
Preadmission screen  

## 2018-03-07 ENCOUNTER — Encounter (HOSPITAL_COMMUNITY): Payer: Self-pay | Admitting: *Deleted

## 2018-03-07 ENCOUNTER — Telehealth (HOSPITAL_COMMUNITY): Payer: Self-pay | Admitting: *Deleted

## 2018-03-07 NOTE — Telephone Encounter (Signed)
Preadmission screen  

## 2018-03-09 ENCOUNTER — Inpatient Hospital Stay (HOSPITAL_COMMUNITY)
Admission: AD | Admit: 2018-03-09 | Payer: BLUE CROSS/BLUE SHIELD | Source: Ambulatory Visit | Admitting: Obstetrics and Gynecology

## 2018-03-09 NOTE — H&P (Signed)
Samantha Rogers is a 32 y.o. female presenting for post dates IOL. Pregnancy uncomplicated. OB History    Gravida  2   Para  1   Term  1   Preterm      AB      Living  1     SAB      TAB      Ectopic      Multiple  0   Live Births  1          Past Medical History:  Diagnosis Date  . Pregnancy induced hypertension    Past Surgical History:  Procedure Laterality Date  . BUNIONECTOMY    . TONSILLECTOMY    . WISDOM TOOTH EXTRACTION     Family History: family history includes Bipolar disorder in her mother; Heart disease in her maternal grandmother; Hypertension in her father; Stroke in her maternal grandfather and paternal grandfather. Social History:  reports that she has never smoked. She has never used smokeless tobacco. She reports that she does not drink alcohol or use drugs.     Maternal Diabetes: No Genetic Screening: Normal Maternal Ultrasounds/Referrals: Normal Fetal Ultrasounds or other Referrals:  None Maternal Substance Abuse:  No Significant Maternal Medications:  None Significant Maternal Lab Results:  None Other Comments:  None  Review of Systems  Eyes: Negative for blurred vision.  Gastrointestinal: Negative for abdominal pain.  Neurological: Negative for headaches.   Maternal Medical History:  Fetal activity: Perceived fetal activity is normal.        unknown if currently breastfeeding. Maternal Exam:  Abdomen: Fetal presentation: vertex     Physical Exam  Cardiovascular: Normal rate and regular rhythm.  Respiratory: Effort normal and breath sounds normal.  GI: Soft.  Neurological: She has normal reflexes.    Prenatal labs: ABO, Rh: A/Positive/-- (12/26 0000) Antibody: Negative (12/26 0000) Rubella: Immune (12/26 0000) RPR: Nonreactive (12/26 0000)  HBsAg: Negative (12/26 0000)  HIV: Non-reactive (12/26 0000)  GBS:   negative  Assessment/Plan: 32 yo G2P1 @ 40 1/7 week for IOL   Samantha Rogers 03/09/2018, 2:06  PM

## 2018-03-09 NOTE — H&P (Deleted)
  The note originally documented on this encounter has been moved the the encounter in which it belongs.  

## 2018-03-10 ENCOUNTER — Inpatient Hospital Stay (HOSPITAL_COMMUNITY): Payer: BLUE CROSS/BLUE SHIELD | Admitting: Anesthesiology

## 2018-03-10 ENCOUNTER — Encounter (HOSPITAL_COMMUNITY): Payer: Self-pay

## 2018-03-10 ENCOUNTER — Inpatient Hospital Stay (HOSPITAL_COMMUNITY)
Admission: RE | Admit: 2018-03-10 | Discharge: 2018-03-11 | DRG: 807 | Disposition: A | Discharge status: Home / Self Care | Payer: BLUE CROSS/BLUE SHIELD | Attending: Obstetrics and Gynecology | Admitting: Obstetrics and Gynecology

## 2018-03-10 DIAGNOSIS — O480 Post-term pregnancy: Secondary | ICD-10-CM | POA: Diagnosis present

## 2018-03-10 DIAGNOSIS — Z3A40 40 weeks gestation of pregnancy: Secondary | ICD-10-CM | POA: Diagnosis not present

## 2018-03-10 LAB — CBC
HCT: 35.1 % — ABNORMAL LOW (ref 36.0–46.0)
HEMOGLOBIN: 12 g/dL (ref 12.0–15.0)
MCH: 30.8 pg (ref 26.0–34.0)
MCHC: 34.2 g/dL (ref 30.0–36.0)
MCV: 90.2 fL (ref 78.0–100.0)
Platelets: 201 10*3/uL (ref 150–400)
RBC: 3.89 MIL/uL (ref 3.87–5.11)
RDW: 13.1 % (ref 11.5–15.5)
WBC: 8.8 10*3/uL (ref 4.0–10.5)

## 2018-03-10 LAB — TYPE AND SCREEN
ABO/RH(D): A POS
ANTIBODY SCREEN: NEGATIVE

## 2018-03-10 LAB — RPR: RPR Ser Ql: NONREACTIVE

## 2018-03-10 LAB — OB RESULTS CONSOLE GBS: GBS: NEGATIVE

## 2018-03-10 MED ORDER — LIDOCAINE HCL (PF) 1 % IJ SOLN
30.0000 mL | INTRAMUSCULAR | Status: DC | PRN
  Filled 2018-03-10: qty 30

## 2018-03-10 MED ORDER — IBUPROFEN 600 MG PO TABS
600.0000 mg | ORAL_TABLET | Freq: Four times a day (QID) | ORAL | Status: DC
  Administered 2018-03-10 – 2018-03-11 (×4): 600 mg via ORAL
  Filled 2018-03-10 (×4): qty 1

## 2018-03-10 MED ORDER — LACTATED RINGERS IV SOLN: 500.0000 mL | INTRAVENOUS | Status: DC | PRN

## 2018-03-10 MED ORDER — BUTORPHANOL TARTRATE 1 MG/ML IJ SOLN: 1.0000 mg | INTRAMUSCULAR | Status: DC | PRN

## 2018-03-10 MED ORDER — LIDOCAINE HCL (PF) 1 % IJ SOLN
INTRAMUSCULAR | Status: DC | PRN
  Administered 2018-03-10: 8 mL via EPIDURAL

## 2018-03-10 MED ORDER — BENZOCAINE-MENTHOL 20-0.5 % EX AERO
1.0000 "application " | INHALATION_SPRAY | CUTANEOUS | Status: DC | PRN
  Administered 2018-03-10: 1 via TOPICAL
  Filled 2018-03-10: qty 56

## 2018-03-10 MED ORDER — SIMETHICONE 80 MG PO CHEW: 80.0000 mg | CHEWABLE_TABLET | ORAL | Status: DC | PRN

## 2018-03-10 MED ORDER — OXYTOCIN 40 UNITS IN LACTATED RINGERS INFUSION - SIMPLE MED
2.5000 [IU]/h | INTRAVENOUS | Status: DC
  Filled 2018-03-10: qty 1000

## 2018-03-10 MED ORDER — DIPHENHYDRAMINE HCL 50 MG/ML IJ SOLN: 12.5000 mg | INTRAMUSCULAR | Status: DC | PRN

## 2018-03-10 MED ORDER — TERBUTALINE SULFATE 1 MG/ML IJ SOLN
0.2500 mg | Freq: Once | INTRAMUSCULAR | Status: DC | PRN
  Filled 2018-03-10: qty 1

## 2018-03-10 MED ORDER — WITCH HAZEL-GLYCERIN EX PADS: 1.0000 "application " | MEDICATED_PAD | CUTANEOUS | Status: DC | PRN

## 2018-03-10 MED ORDER — DIPHENHYDRAMINE HCL 25 MG PO CAPS: 25.0000 mg | ORAL_CAPSULE | Freq: Four times a day (QID) | ORAL | Status: DC | PRN

## 2018-03-10 MED ORDER — ONDANSETRON HCL 4 MG/2ML IJ SOLN: 4.0000 mg | INTRAMUSCULAR | Status: DC | PRN

## 2018-03-10 MED ORDER — METHYLERGONOVINE MALEATE 0.2 MG/ML IJ SOLN
0.2000 mg | Freq: Once | INTRAMUSCULAR | Status: AC
  Administered 2018-03-10: 0.2 mg via INTRAMUSCULAR

## 2018-03-10 MED ORDER — OXYCODONE HCL 5 MG PO TABS: 5.0000 mg | ORAL_TABLET | ORAL | Status: DC | PRN

## 2018-03-10 MED ORDER — OXYCODONE-ACETAMINOPHEN 5-325 MG PO TABS: 1.0000 | ORAL_TABLET | ORAL | Status: DC | PRN

## 2018-03-10 MED ORDER — FLEET ENEMA 7-19 GM/118ML RE ENEM: 1.0000 | ENEMA | RECTAL | Status: DC | PRN

## 2018-03-10 MED ORDER — METHYLERGONOVINE MALEATE 0.2 MG/ML IJ SOLN
INTRAMUSCULAR | Status: AC
  Administered 2018-03-10: 0.2 mg via INTRAMUSCULAR
  Filled 2018-03-10: qty 1

## 2018-03-10 MED ORDER — PHENYLEPHRINE 40 MCG/ML (10ML) SYRINGE FOR IV PUSH (FOR BLOOD PRESSURE SUPPORT)
80.0000 ug | PREFILLED_SYRINGE | INTRAVENOUS | Status: DC | PRN
  Filled 2018-03-10: qty 5

## 2018-03-10 MED ORDER — ACETAMINOPHEN 325 MG PO TABS
650.0000 mg | ORAL_TABLET | ORAL | Status: DC | PRN
  Administered 2018-03-10 – 2018-03-11 (×2): 650 mg via ORAL
  Filled 2018-03-10 (×2): qty 2

## 2018-03-10 MED ORDER — ASPIRIN EC 81 MG PO TBEC
81.0000 mg | DELAYED_RELEASE_TABLET | Freq: Every day | ORAL | Status: DC
  Administered 2018-03-10: 81 mg via ORAL
  Filled 2018-03-10 (×2): qty 1

## 2018-03-10 MED ORDER — LACTATED RINGERS IV SOLN
INTRAVENOUS | Status: DC
  Administered 2018-03-10: 15:00:00 via INTRAVENOUS

## 2018-03-10 MED ORDER — FENTANYL 2.5 MCG/ML BUPIVACAINE 1/10 % EPIDURAL INFUSION (WH - ANES)
14.0000 mL/h | INTRAMUSCULAR | Status: DC | PRN
  Administered 2018-03-10: 14 mL/h via EPIDURAL
  Filled 2018-03-10: qty 100

## 2018-03-10 MED ORDER — LACTATED RINGERS IV SOLN: 500.0000 mL | Freq: Once | INTRAVENOUS | Status: DC

## 2018-03-10 MED ORDER — OXYCODONE-ACETAMINOPHEN 5-325 MG PO TABS
2.0000 | ORAL_TABLET | ORAL | Status: DC | PRN
Start: 2018-03-10 — End: 2018-03-10

## 2018-03-10 MED ORDER — ZOLPIDEM TARTRATE 5 MG PO TABS
5.0000 mg | ORAL_TABLET | Freq: Every evening | ORAL | Status: DC | PRN
  Administered 2018-03-10: 5 mg via ORAL
  Filled 2018-03-10: qty 1

## 2018-03-10 MED ORDER — PHENYLEPHRINE 40 MCG/ML (10ML) SYRINGE FOR IV PUSH (FOR BLOOD PRESSURE SUPPORT)
80.0000 ug | PREFILLED_SYRINGE | INTRAVENOUS | Status: DC | PRN
  Filled 2018-03-10: qty 10
  Filled 2018-03-10: qty 5

## 2018-03-10 MED ORDER — SOD CITRATE-CITRIC ACID 500-334 MG/5ML PO SOLN: 30.0000 mL | ORAL | Status: DC | PRN

## 2018-03-10 MED ORDER — ZOLPIDEM TARTRATE 5 MG PO TABS: 5.0000 mg | ORAL_TABLET | Freq: Every evening | ORAL | Status: DC | PRN

## 2018-03-10 MED ORDER — ONDANSETRON HCL 4 MG PO TABS
4.0000 mg | ORAL_TABLET | ORAL | Status: DC | PRN
Start: 2018-03-10 — End: 2018-03-11

## 2018-03-10 MED ORDER — OXYTOCIN BOLUS FROM INFUSION
500.0000 mL | Freq: Once | INTRAVENOUS | Status: AC
  Administered 2018-03-10: 500 mL via INTRAVENOUS

## 2018-03-10 MED ORDER — EPHEDRINE 5 MG/ML INJ
10.0000 mg | INTRAVENOUS | Status: DC | PRN
  Filled 2018-03-10: qty 2

## 2018-03-10 MED ORDER — OXYTOCIN 40 UNITS IN LACTATED RINGERS INFUSION - SIMPLE MED
1.0000 m[IU]/min | INTRAVENOUS | Status: DC
  Administered 2018-03-10: 2 m[IU]/min via INTRAVENOUS
  Administered 2018-03-10: 4 m[IU]/min via INTRAVENOUS
  Filled 2018-03-10: qty 1000

## 2018-03-10 MED ORDER — DIBUCAINE 1 % RE OINT: 1.0000 "application " | TOPICAL_OINTMENT | RECTAL | Status: DC | PRN

## 2018-03-10 MED ORDER — TETANUS-DIPHTH-ACELL PERTUSSIS 5-2.5-18.5 LF-MCG/0.5 IM SUSP: 0.5000 mL | Freq: Once | INTRAMUSCULAR | Status: DC

## 2018-03-10 MED ORDER — COCONUT OIL OIL: 1.0000 "application " | TOPICAL_OIL | Status: DC | PRN

## 2018-03-10 MED ORDER — ONDANSETRON HCL 4 MG/2ML IJ SOLN: 4.0000 mg | Freq: Four times a day (QID) | INTRAMUSCULAR | Status: DC | PRN

## 2018-03-10 MED ORDER — PRENATAL MULTIVITAMIN CH
1.0000 | ORAL_TABLET | Freq: Every day | ORAL | Status: DC
  Administered 2018-03-11: 1 via ORAL
  Filled 2018-03-10: qty 1

## 2018-03-10 MED ORDER — LIDOCAINE-EPINEPHRINE (PF) 2 %-1:200000 IJ SOLN
INTRAMUSCULAR | Status: DC | PRN
  Administered 2018-03-10: 5 mL via EPIDURAL

## 2018-03-10 MED ORDER — OXYCODONE HCL 5 MG PO TABS: 10.0000 mg | ORAL_TABLET | ORAL | Status: DC | PRN

## 2018-03-10 MED ORDER — ACETAMINOPHEN 325 MG PO TABS: 650.0000 mg | ORAL_TABLET | ORAL | Status: DC | PRN

## 2018-03-10 MED ORDER — SENNOSIDES-DOCUSATE SODIUM 8.6-50 MG PO TABS
2.0000 | ORAL_TABLET | ORAL | Status: DC
  Administered 2018-03-10: 2 via ORAL
  Filled 2018-03-10: qty 2

## 2018-03-10 MED ORDER — MISOPROSTOL 25 MCG QUARTER TABLET
25.0000 ug | ORAL_TABLET | ORAL | Status: DC | PRN
  Administered 2018-03-10 (×3): 25 ug via VAGINAL
  Filled 2018-03-10 (×4): qty 1

## 2018-03-10 NOTE — Progress Notes (Signed)
Delivery Note At 5:02 PM a viable female was delivered via Vaginal, Spontaneous (Presentation: ROA;  ).  APGAR: 8, 9; weight  .   Placenta status: intact, .  Cord:  3 vessels with the following complications:tight nuchal cord x 2 .  Cord pH: pending Rapid second stage>push x 2 Anesthesia: epidural  Episiotomy: None Lacerations: 2nd degree lac repaired Suture Repair: 2.0 vicryl rapide Est. Blood Loss (mL):  350  Mom to postpartum.  Baby to Couplet care / Skin to Skin.  Roselle LocusJames E Virgilia Quigg II 03/10/2018, 5:18 PM

## 2018-03-10 NOTE — Anesthesia Postprocedure Evaluation (Signed)
Anesthesia Post Note  Patient: [name]  Procedure(s) Performed: AN AD HOC LABOR EPIDURAL     Patient location during evaluation: Mother Baby Anesthesia Type: Epidural Level of consciousness: awake and alert Pain management: pain level controlled Vital Signs Assessment: post-procedure vital signs reviewed and stable Respiratory status: spontaneous breathing, nonlabored ventilation and respiratory function stable Cardiovascular status: stable Postop Assessment: no headache, no backache and epidural receding Anesthetic complications: no    Last Vitals:  Vitals:   03/10/18 1715 03/10/18 1725  BP: 104/79 136/72  Pulse:  (!) 119  Resp: 16   Temp:    SpO2:      Last Pain:  Vitals:   03/10/18 1557  TempSrc:   PainSc: 7    Pain Goal:                 Abanoub Hanken

## 2018-03-10 NOTE — Anesthesia Pain Management Evaluation Note (Signed)
  CRNA Pain Management Visit Note  Patient: Samantha Rogers, 32 y.o., female  "Hello I am a member of the anesthesia team at [facility]. We have an anesthesia team available at all times to provide care throughout the hospital, including epidural management and anesthesia for C-section. I don't know your plan for the delivery whether it a natural birth, water birth, IV sedation, nitrous supplementation, doula or epidural, but we want to meet your pain goals."   1.Was your pain managed to your expectations on prior hospitalizations?   Yes   2.What is your expectation for pain management during this hospitalization?     Nitrous Oxide  3.How can we help you reach that goal? Nursing support  Record the patient's initial score and the patient's pain goal.   Pain: 2/10     Pain Goal: 4/10 [facility] wants you to be able to say your pain was always managed very well.  Samantha Rogers, Samantha Rogers 03/10/2018

## 2018-03-10 NOTE — Progress Notes (Signed)
S/P Cytotec x 3 FHT cat one UCs q3-5 min, mod Cx 2-3/50/soft/-2 D/W patient>begin pitocin

## 2018-03-10 NOTE — Progress Notes (Signed)
No change to H&P per patient history No ROM  FHT cat one UC irreg, mild cytotec x 2-last about 5am Check cervix about 9am  D/W patient

## 2018-03-10 NOTE — Anesthesia Procedure Notes (Signed)
Epidural Patient location during procedure: OB Start time: 03/10/2018 4:40 PM End time: 03/10/2018 4:50 PM  Staffing Anesthesiologist: Bethena Midgetddono, Jaydon Soroka, MD  Preanesthetic Checklist Completed: patient identified, site marked, surgical consent, pre-op evaluation, timeout performed, IV checked, risks and benefits discussed and monitors and equipment checked  Epidural Patient position: sitting Prep: site prepped and draped and DuraPrep Patient monitoring: continuous pulse ox and blood pressure Approach: midline Location: L4-L5 Injection technique: LOR air  Needle:  Needle type: Tuohy  Needle gauge: 17 G Needle length: 9 cm and 9 Needle insertion depth: 5 cm cm Catheter type: closed end flexible Catheter size: 19 Gauge Catheter at skin depth: 10 cm Test dose: negative  Assessment Events: blood not aspirated, injection not painful, no injection resistance, negative IV test and no paresthesia

## 2018-03-10 NOTE — Anesthesia Preprocedure Evaluation (Signed)
Anesthesia Evaluation  Patient identified by MRN, date of birth, ID band Patient awake    Reviewed: Allergy & Precautions, H&P , NPO status , Patient's Chart, lab work & pertinent test results, reviewed documented beta blocker date and time   Airway Mallampati: II  TM Distance: >3 FB Neck ROM: full    Dental no notable dental hx.    Pulmonary neg pulmonary ROS,    Pulmonary exam normal breath sounds clear to auscultation       Cardiovascular hypertension, negative cardio ROS Normal cardiovascular exam Rhythm:regular Rate:Normal     Neuro/Psych negative neurological ROS  negative psych ROS   GI/Hepatic negative GI ROS, Neg liver ROS,   Endo/Other  negative endocrine ROS  Renal/GU negative Renal ROS  negative genitourinary   Musculoskeletal   Abdominal   Peds  Hematology negative hematology ROS (+)   Anesthesia Other Findings   Reproductive/Obstetrics (+) Pregnancy                             Anesthesia Physical Anesthesia Plan  ASA: II  Anesthesia Plan: Epidural   Post-op Pain Management:    Induction:   PONV Risk Score and Plan:   Airway Management Planned:   Additional Equipment:   Intra-op Plan:   Post-operative Plan:   Informed Consent: I have reviewed the patients History and Physical, chart, labs and discussed the procedure including the risks, benefits and alternatives for the proposed anesthesia with the patient or authorized representative who has indicated his/her understanding and acceptance.       Plan Discussed with:   Anesthesia Plan Comments:         Anesthesia Quick Evaluation  

## 2018-03-11 ENCOUNTER — Other Ambulatory Visit: Payer: Self-pay

## 2018-03-11 LAB — CBC
HCT: 35.9 % — ABNORMAL LOW (ref 36.0–46.0)
HEMOGLOBIN: 12.5 g/dL (ref 12.0–15.0)
MCH: 31.4 pg (ref 26.0–34.0)
MCHC: 34.8 g/dL (ref 30.0–36.0)
MCV: 90.2 fL (ref 78.0–100.0)
Platelets: 190 10*3/uL (ref 150–400)
RBC: 3.98 MIL/uL (ref 3.87–5.11)
RDW: 13.1 % (ref 11.5–15.5)
WBC: 12.8 10*3/uL — ABNORMAL HIGH (ref 4.0–10.5)

## 2018-03-11 MED ORDER — IBUPROFEN 600 MG PO TABS: 600.0000 mg | ORAL_TABLET | Freq: Four times a day (QID) | ORAL | 0 refills | Status: AC

## 2018-03-11 NOTE — Lactation Note (Signed)
This note was copied from a baby's chart. Lactation Consultation Note  Patient Name: Samantha Rogers ZOXWR'UToday's Date: 03/11/2018 Reason for consult: Follow-up assessment  Mom reports baby is breastfeeding well.  Reports had no challenges with fist baby and she seems to be breastfeeding well. Family in room.  Inquired about coming back at a later time to speak with her. She reports she feels they are fine and Denies need for lactation services at this time.  Urged mom to call as needed Maternal Data    Feeding Feeding Type: Breast Fed  LATCH Score                   Interventions    Lactation Tools Discussed/Used     Consult Status Consult Status: Follow-up Date: 03/12/18 Follow-up type: In-patient    [facility] Michaelle CopasS Abrham Maslowski 03/11/2018, 3:38 PM

## 2018-03-11 NOTE — Anesthesia Postprocedure Evaluation (Signed)
Anesthesia Post Note  Patient: [name]  Procedure(s) Performed: AN AD HOC LABOR EPIDURAL     Patient location during evaluation: Mother Baby Anesthesia Type: Epidural Level of consciousness: awake Pain management: pain level controlled Vital Signs Assessment: post-procedure vital signs reviewed and stable Respiratory status: spontaneous breathing Cardiovascular status: stable Postop Assessment: patient able to bend at knees and epidural receding Anesthetic complications: no    Last Vitals:  Vitals:   03/10/18 2350 03/11/18 0514  BP: 103/67 109/77  Pulse: 65 68  Resp: 18 18  Temp: 36.8 C 36.7 C  SpO2: 98% 98%    Last Pain:  Vitals:   03/11/18 0514  TempSrc: Oral  PainSc:    Pain Goal:                 [name],[name]

## 2018-03-11 NOTE — Discharge Summary (Signed)
Obstetric Discharge Summary Reason for Admission: onset of labor Prenatal Procedures: none Intrapartum Procedures: spontaneous vaginal delivery Postpartum Procedures: none Complications-Operative and Postpartum: none Hemoglobin  Date Value Ref Range Status  03/11/2018 12.5 12.0 - 15.0 g/dL Final   HCT  Date Value Ref Range Status  03/11/2018 35.9 (L) 36.0 - 46.0 % Final    Physical Exam:  General: alert Lochia: appropriate Uterine Fundus: firm Incision: healing well DVT Evaluation: No evidence of DVT seen on physical exam.  Discharge Diagnoses: Term Pregnancy-delivered  Discharge Information: Date: 03/11/2018 Activity: pelvic rest Diet: routine Medications: None Condition: stable Instructions: refer to practice specific booklet Discharge to: home Follow-up Information    [place] in 6 week(s).   Contact information: [address] [place] [address] [phone]           Newborn Data: Live born female  Birth Weight: 7 lb 6.9 oz (3370 g) APGAR: 8, 9  Newborn Delivery   Birth date/time:  03/10/2018 17:02:00 Delivery type:  Vaginal, Spontaneous     Home with mother.  Samantha Rogers Samantha Rogers 03/11/2018, 9:13 AM

## 2018-03-11 NOTE — Lactation Note (Signed)
This note was copied from a baby's chart. Lactation Consultation Note  Patient Name: Samantha Linus OrnJenna Brewton ZOXWR'UToday's Date: 03/11/2018 Reason for consult: Initial assessment;Term P2, 12 hour female infant Mom experienced  BF, BF older daughter for 1 year. Per parents, infant had 3 stools(black) meconium and 1 wet diaper since delivery. Per mom, BF four times since delivery. Mom attempted latch infant to breast but infant very sleepy and would not latch to breast. LC unable to observe latch at this time. Mom will do STS and attempt to latch again in next 30 mins. Mom is knowledge about Breast massage, compression and hand expression, [facility]. LC discussed  I&O w/ family. Discussed feeding according infant hunger cues and not exceeding 3 hours to feed infant. Reviewed Baby & Me book's Breastfeeding Basics.  Mom made aware of O/P services, breastfeeding support groups, community resources, and our phone # for post-discharge questions.   Maternal Data Has patient been taught Hand Expression?: Yes Does the patient have breastfeeding experience prior to this delivery?: Yes  Feeding Feeding Type: Breast Fed Length of feed: 5 min  LATCH Score Latch: Too sleepy or reluctant, no latch achieved, no sucking elicited.  Audible Swallowing: None  Type of Nipple: Everted at rest and after stimulation  Comfort (Breast/Nipple): Soft / non-tender  Hold (Positioning): No assistance needed to correctly position infant at breast.  LATCH Score: 6  Interventions Interventions: Breast feeding basics reviewed;Skin to skin;Hand express;Breast massage;Support pillows;Adjust position;Breast compression  Lactation Tools Discussed/Used     Consult Status Consult Status: Follow-up Date: 03/11/18 Follow-up type: In-patient    Danelle EarthlyRobin Jeanette Moffatt 03/11/2018, 6:02 AM

## 2018-03-11 NOTE — Addendum Note (Signed)
Addendum  created 03/11/18 0747 by Earmon PhoenixWilkerson, Shantell Belongia P, CRNA   Charge Capture section accepted, Sign clinical note

## 2018-07-07 IMAGING — US US OB < 14 WEEKS - US OB TV
1 series · 14 of 14 positions shown · non-contrast
Comparison: None.

CLINICAL DATA: Bleeding and cramping.

EXAM:
OBSTETRIC <14 WK US AND TRANSVAGINAL OB US
TECHNIQUE: Both transabdominal and transvaginal ultrasound examinations were
performed for complete evaluation of the gestation as well as the
maternal uterus, adnexal regions, and pelvic cul-de-sac.
Transvaginal technique was performed to assess early pregnancy.

[Series 1: us ob < 14 weeks - us ob tv · 14 acquisitions, 14 frames shown]
[im 1/14]
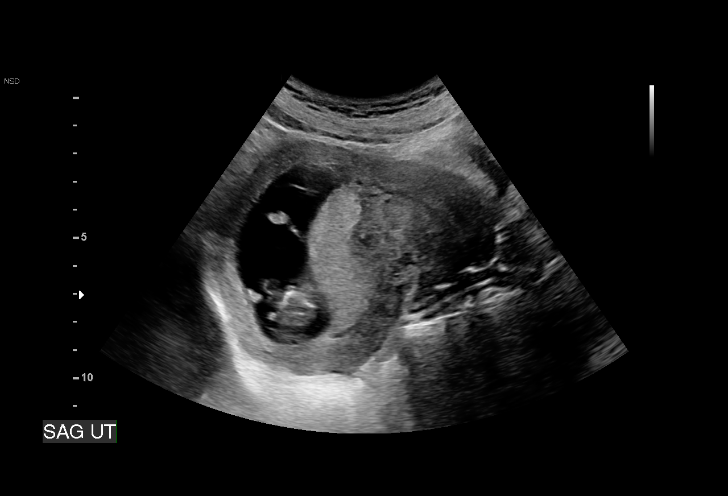
[im 2/14]
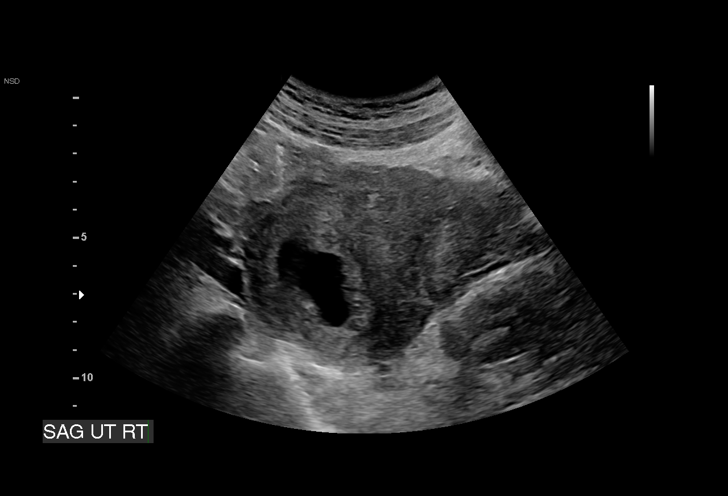
[im 3/14]
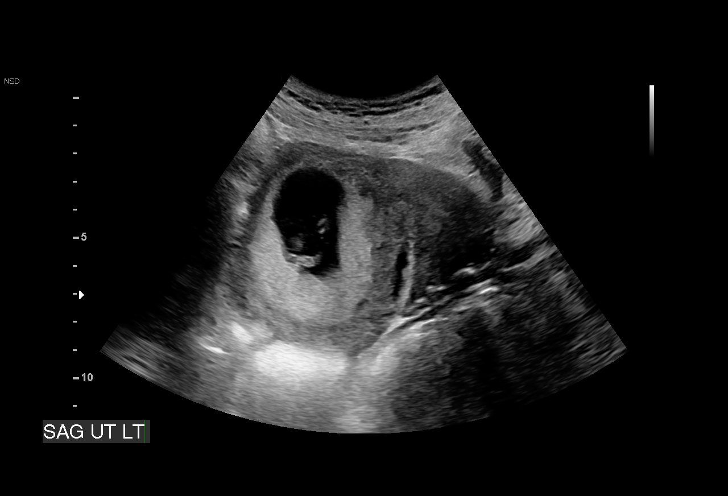
[im 4/14]
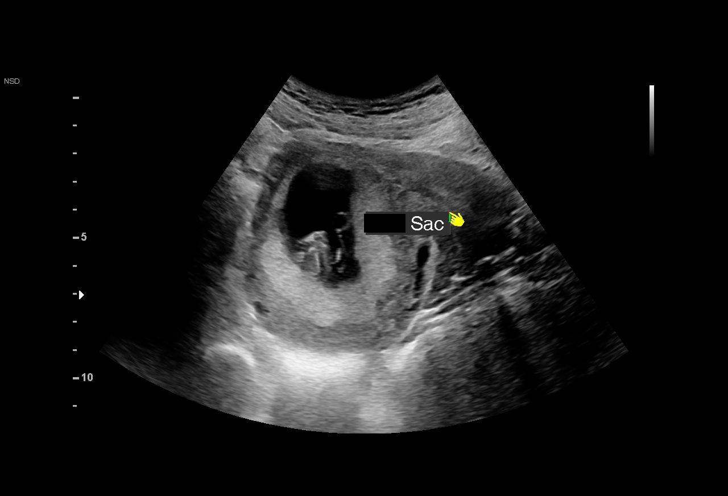
[im 5/14]
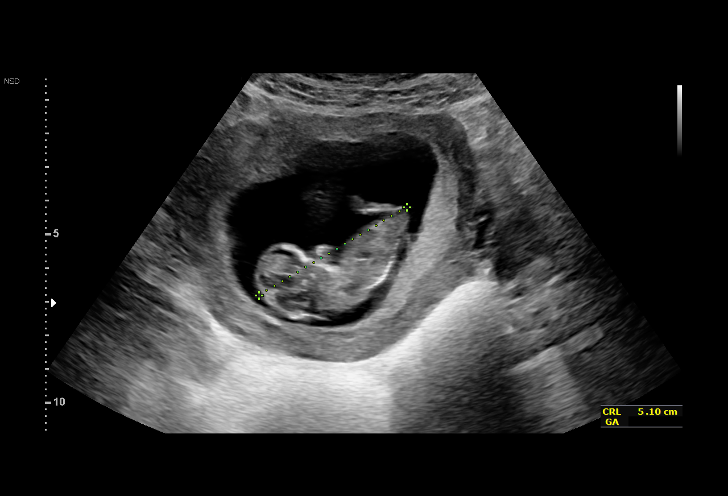
[im 6/14]
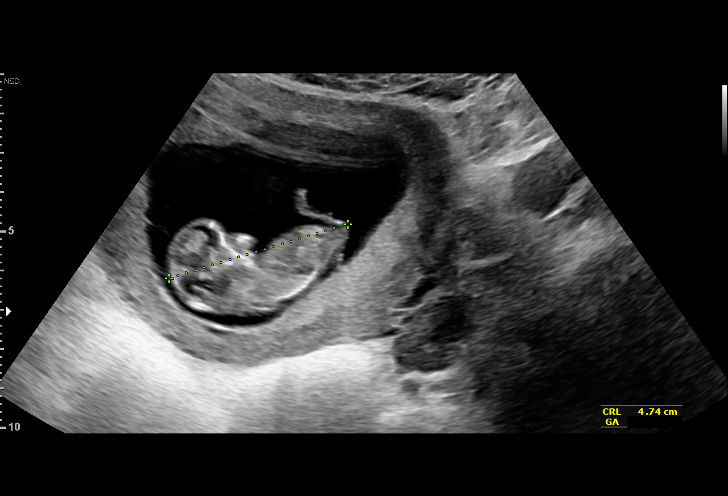
[im 7/14]
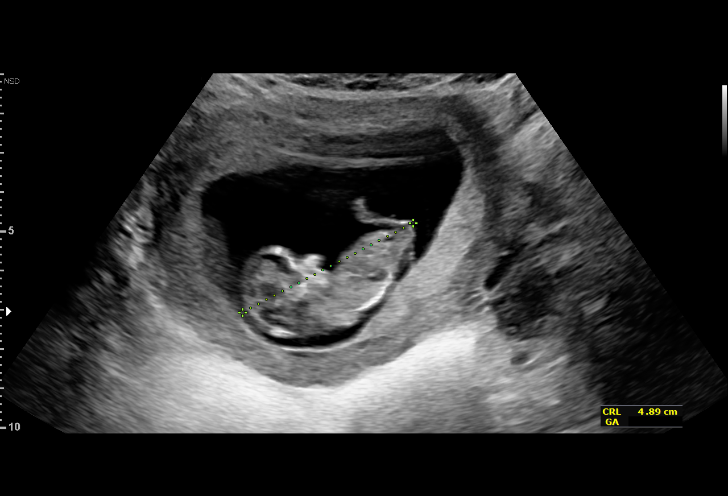
[im 8/14]
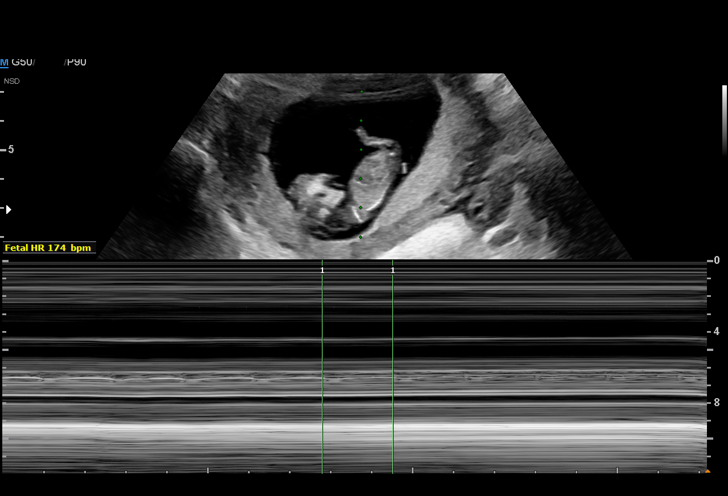
[im 9/14]
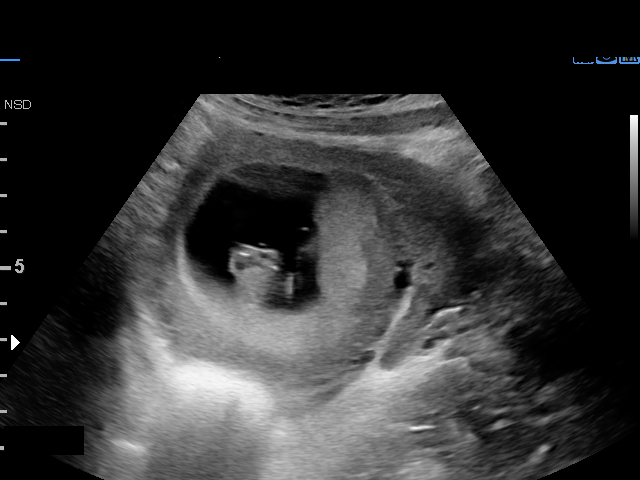
[im 10/14]
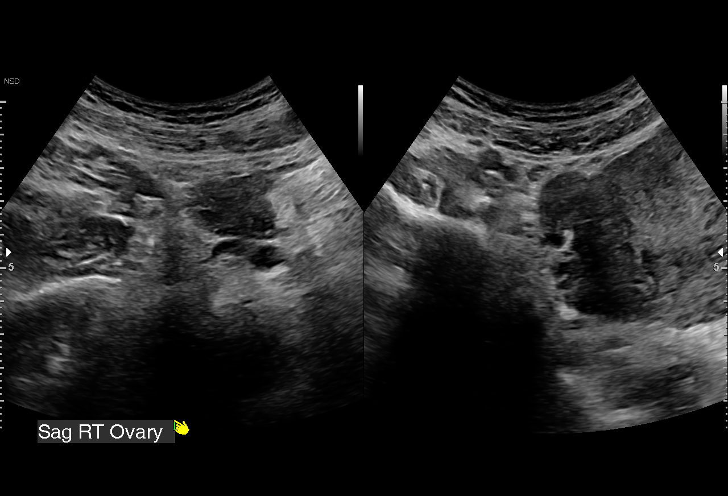
[im 11/14]
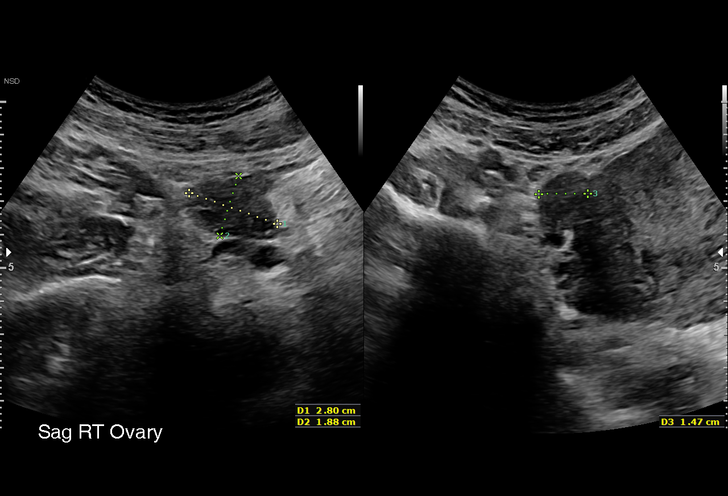
[im 12/14]
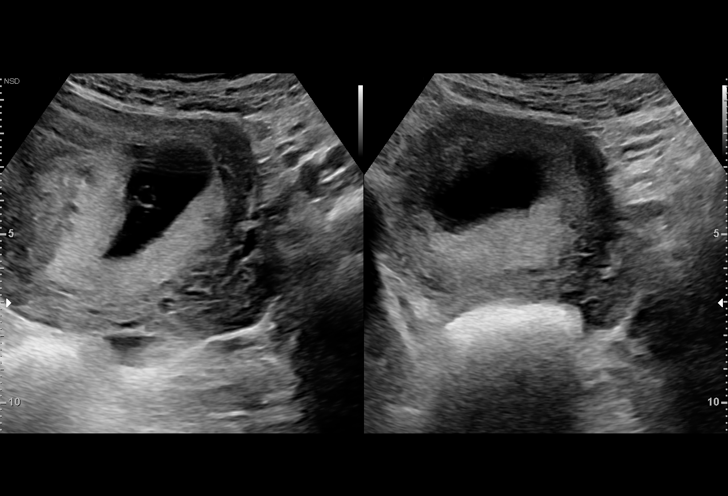
[im 13/14]
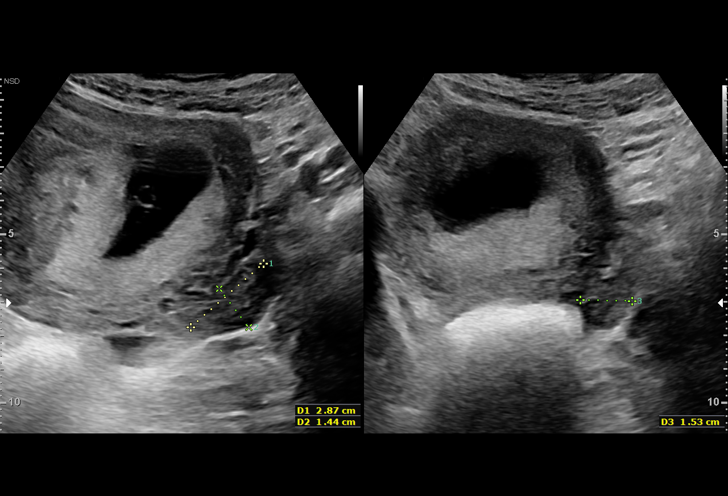
[im 14/14]
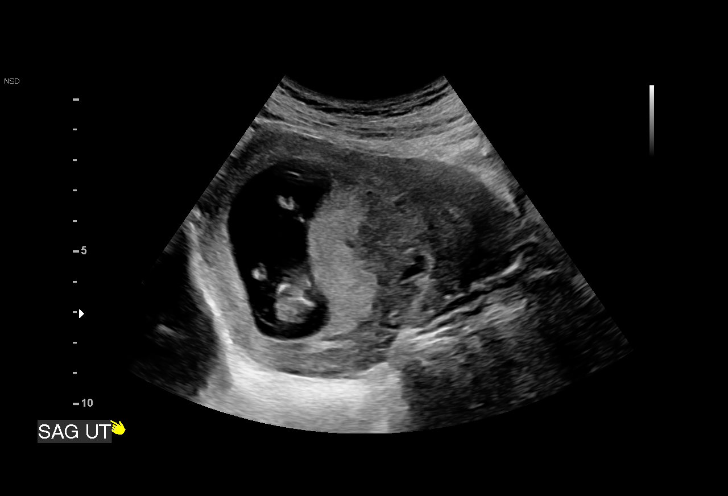

[14 of 14 positions shown; findings below may reference images not displayed]

FINDINGS: Intrauterine gestational sac: Single

Yolk sac:  Visualized.

Embryo:  Visualized.

Cardiac Activity: Visualized.

Heart Rate: 174 bpm

CRL:  49 mm   11 w   5 d                  US EDC: 03/08/2018

Subchorionic hemorrhage:  None visualized.

Maternal uterus/adnexae: Normal appearance of bilateral ovarian
tissue. No free fluid.
IMPRESSION: Single live intrauterine pregnancy. Calculated gestational age by
ultrasound is 11 weeks and 5 days.

## 2018-08-03 HISTORY — PX: BREAST ENHANCEMENT SURGERY: SHX7

## 2020-11-19 LAB — OB RESULTS CONSOLE RPR: RPR: NONREACTIVE

## 2020-11-19 LAB — OB RESULTS CONSOLE HEPATITIS B SURFACE ANTIGEN: Hepatitis B Surface Ag: NEGATIVE

## 2020-11-27 LAB — OB RESULTS CONSOLE RUBELLA ANTIBODY, IGM: Rubella: NON-IMMUNE/NOT IMMUNE

## 2020-11-27 LAB — OB RESULTS CONSOLE GC/CHLAMYDIA
Chlamydia: NEGATIVE
Gonorrhea: NEGATIVE

## 2020-11-27 LAB — OB RESULTS CONSOLE HIV ANTIBODY (ROUTINE TESTING): HIV: NONREACTIVE

## 2021-05-28 LAB — OB RESULTS CONSOLE GBS: GBS: NEGATIVE

## 2021-06-11 ENCOUNTER — Inpatient Hospital Stay (HOSPITAL_COMMUNITY)
Admission: AD | Admit: 2021-06-11 | Discharge: 2021-06-14 | DRG: 807 | Disposition: A | Discharge status: Home / Self Care | Payer: PRIVATE HEALTH INSURANCE | Attending: Obstetrics and Gynecology | Admitting: Obstetrics and Gynecology

## 2021-06-11 ENCOUNTER — Other Ambulatory Visit: Payer: Self-pay

## 2021-06-11 ENCOUNTER — Encounter (HOSPITAL_COMMUNITY): Payer: Self-pay | Admitting: Obstetrics and Gynecology

## 2021-06-11 DIAGNOSIS — O134 Gestational [pregnancy-induced] hypertension without significant proteinuria, complicating childbirth: Principal | ICD-10-CM | POA: Diagnosis present

## 2021-06-11 DIAGNOSIS — Z20822 Contact with and (suspected) exposure to covid-19: Secondary | ICD-10-CM | POA: Diagnosis present

## 2021-06-11 DIAGNOSIS — Z3A38 38 weeks gestation of pregnancy: Secondary | ICD-10-CM | POA: Diagnosis not present

## 2021-06-11 LAB — COMPREHENSIVE METABOLIC PANEL
ALT: 18 U/L (ref 0–44)
AST: 25 U/L (ref 15–41)
Albumin: 2.7 g/dL — ABNORMAL LOW (ref 3.5–5.0)
Alkaline Phosphatase: 154 U/L — ABNORMAL HIGH (ref 38–126)
Anion gap: 10 (ref 5–15)
BUN: 12 mg/dL (ref 6–20)
CO2: 21 mmol/L — ABNORMAL LOW (ref 22–32)
Calcium: 8.9 mg/dL (ref 8.9–10.3)
Chloride: 100 mmol/L (ref 98–111)
Creatinine, Ser: 0.79 mg/dL (ref 0.44–1.00)
GFR, Estimated: >60 mL/min (ref >60)
Glucose, Bld: 99 mg/dL (ref 70–99)
Potassium: 3.8 mmol/L (ref 3.5–5.1)
Sodium: 131 mmol/L — ABNORMAL LOW (ref 135–145)
Total Bilirubin: 0.6 mg/dL (ref 0.3–1.2)
Total Protein: 5.5 g/dL — ABNORMAL LOW (ref 6.5–8.1)

## 2021-06-11 LAB — CBC
HCT: 38.2 % (ref 36.0–46.0)
Hemoglobin: 13.5 g/dL (ref 12.0–15.0)
MCH: 32.5 pg (ref 26.0–34.0)
MCHC: 35.3 g/dL (ref 30.0–36.0)
MCV: 91.8 fL (ref 80.0–100.0)
Platelets: 142 10*3/uL — ABNORMAL LOW (ref 150–400)
RBC: 4.16 MIL/uL (ref 3.87–5.11)
RDW: 12.2 % (ref 11.5–15.5)
WBC: 8 10*3/uL (ref 4.0–10.5)
nRBC: 0 % (ref 0.0–0.2)

## 2021-06-11 LAB — RESP PANEL BY RT-PCR (FLU A&B, COVID) ARPGX2
Influenza A by PCR: NEGATIVE
Influenza B by PCR: NEGATIVE
SARS Coronavirus 2 by RT PCR: NEGATIVE

## 2021-06-11 LAB — PROTEIN / CREATININE RATIO, URINE
Creatinine, Urine: 50.72 mg/dL
Protein Creatinine Ratio: 0.63 mg/mg{Cre} — ABNORMAL HIGH (ref 0.00–0.15)
Total Protein, Urine: 32 mg/dL

## 2021-06-11 LAB — TYPE AND SCREEN
ABO/RH(D): A POS
Antibody Screen: NEGATIVE

## 2021-06-11 MED ORDER — ZOLPIDEM TARTRATE 5 MG PO TABS: 5.0000 mg | ORAL_TABLET | Freq: Every evening | ORAL | Status: DC | PRN

## 2021-06-11 MED ORDER — MISOPROSTOL 25 MCG QUARTER TABLET: 25.0000 ug | ORAL_TABLET | ORAL | Status: DC | PRN

## 2021-06-11 MED ORDER — LACTATED RINGERS IV SOLN: INTRAVENOUS | Status: DC

## 2021-06-11 MED ORDER — OXYTOCIN BOLUS FROM INFUSION: 333.0000 mL | Freq: Once | INTRAVENOUS | Status: DC

## 2021-06-11 MED ORDER — LABETALOL HCL 5 MG/ML IV SOLN: 40.0000 mg | INTRAVENOUS | Status: DC | PRN

## 2021-06-11 MED ORDER — OXYTOCIN-SODIUM CHLORIDE 30-0.9 UT/500ML-% IV SOLN: 2.5000 [IU]/h | INTRAVENOUS | Status: DC

## 2021-06-11 MED ORDER — LIDOCAINE HCL (PF) 1 % IJ SOLN: 30.0000 mL | INTRAMUSCULAR | Status: DC | PRN

## 2021-06-11 MED ORDER — HYDRALAZINE HCL 20 MG/ML IJ SOLN: 10.0000 mg | INTRAMUSCULAR | Status: DC | PRN

## 2021-06-11 MED ORDER — OXYCODONE-ACETAMINOPHEN 5-325 MG PO TABS: 2.0000 | ORAL_TABLET | ORAL | Status: DC | PRN

## 2021-06-11 MED ORDER — ONDANSETRON HCL 4 MG/2ML IJ SOLN
4.0000 mg | Freq: Four times a day (QID) | INTRAMUSCULAR | Status: DC | PRN
  Administered 2021-06-12: 4 mg via INTRAVENOUS
  Filled 2021-06-11: qty 2

## 2021-06-11 MED ORDER — LABETALOL HCL 5 MG/ML IV SOLN: 20.0000 mg | INTRAVENOUS | Status: DC | PRN

## 2021-06-11 MED ORDER — SOD CITRATE-CITRIC ACID 500-334 MG/5ML PO SOLN: 30.0000 mL | ORAL | Status: DC | PRN

## 2021-06-11 MED ORDER — ACETAMINOPHEN 325 MG PO TABS
650.0000 mg | ORAL_TABLET | ORAL | Status: DC | PRN
  Administered 2021-06-11: 650 mg via ORAL
  Filled 2021-06-11: qty 2

## 2021-06-11 MED ORDER — OXYCODONE-ACETAMINOPHEN 5-325 MG PO TABS: 1.0000 | ORAL_TABLET | ORAL | Status: DC | PRN

## 2021-06-11 MED ORDER — MISOPROSTOL 25 MCG QUARTER TABLET
25.0000 ug | ORAL_TABLET | ORAL | Status: DC
  Administered 2021-06-11 – 2021-06-12 (×4): 25 ug via VAGINAL
  Filled 2021-06-11 (×5): qty 1

## 2021-06-11 MED ORDER — TERBUTALINE SULFATE 1 MG/ML IJ SOLN: 0.2500 mg | Freq: Once | INTRAMUSCULAR | Status: DC | PRN

## 2021-06-11 MED ORDER — LACTATED RINGERS IV SOLN: 500.0000 mL | INTRAVENOUS | Status: DC | PRN

## 2021-06-11 MED ORDER — LABETALOL HCL 5 MG/ML IV SOLN: 80.0000 mg | INTRAVENOUS | Status: DC | PRN

## 2021-06-11 NOTE — MAU Note (Signed)
.  Samantha Rogers is a 35 y.o. at [redacted]w[redacted]d here in MAU reporting: sent from office for elevated BP. Also had a headache this morning, she took 1,000 mg of tylenol at 0900. Has occasional flashes of light but denies current visual disturbances or epigastric pain. Denies VB, LOF. Endorses good fetal movement.   Vitals:   06/11/21 1343  BP: (!) 137/96  Pulse: 78  Resp: 15  Temp: 98.9 F (37.2 C)  SpO2: 98%     FHT:157

## 2021-06-11 NOTE — MAU Provider Note (Signed)
History     CSN: [id number]  Arrival date and time: 06/11/21 1307   None     Chief Complaint  Patient presents with   Hypertension   HPI Samantha Rogers is a 34 y.o. G3P2002 at [redacted]w[redacted]d who was sent from office today for evaluation of BP. Patient reports that they have been watching BP's in office for past couple of weeks as BP's have consistently been in 130s/90's. Today while in the office, her BP was 140s/90's. She denies headache currently, but had one this morning which resolved with Tylenol. Denies vision changes, RUQ/epigastric pain, contractions, leaking fluid, or vaginal bleeding. Endorses active fetal movement. Reports that she scheduled for IOL on 11/15.   OB History     Gravida  3   Para  2   Term  2   Preterm      AB      Living  2      SAB      IAB      Ectopic      Multiple  0   Live Births  2           Past Medical History:  Diagnosis Date   Pregnancy induced hypertension     Past Surgical History:  Procedure Laterality Date   BREAST ENHANCEMENT SURGERY Bilateral 2020   BUNIONECTOMY     TONSILLECTOMY     WISDOM TOOTH EXTRACTION      Family History  Problem Relation Age of Onset   Stroke Maternal Grandfather    Stroke Paternal Grandfather    Bipolar disorder Mother    Hypertension Father    Heart disease Maternal Grandmother     Social History   Tobacco Use   Smoking status: Never   Smokeless tobacco: Never  Substance Use Topics   Alcohol use: No   Drug use: No    Allergies:  Allergies  Allergen Reactions   Sulfa Antibiotics Swelling    eyes    Medications Prior to Admission  Medication Sig Dispense Refill Last Dose   Prenatal Vit-Fe Fumarate-FA (PRENATAL MULTIVITAMIN) TABS tablet Take 1 tablet by mouth daily at 12 noon.   06/11/2021   ibuprofen (ADVIL,MOTRIN) 600 MG tablet Take 1 tablet (600 mg total) by mouth every 6 (six) hours. 30 tablet 0     Review of Systems  Constitutional: Negative.   Respiratory:  Negative.    Cardiovascular: Negative.   Gastrointestinal: Negative.   Genitourinary: Negative.   Musculoskeletal: Negative.   Neurological: Negative.   Psychiatric/Behavioral: Negative.    Physical Exam   Blood pressure (!) 146/84, pulse 79, temperature 98.9 F (37.2 C), temperature source Oral, resp. rate 15, SpO2 97 %, unknown if currently breastfeeding.  Physical Exam Constitutional:      General: She is not in acute distress. HENT:     Head: Atraumatic.  Eyes:     Pupils: Pupils are equal, round, and reactive to light.  Cardiovascular:     Rate and Rhythm: Normal rate.  Pulmonary:     Effort: Pulmonary effort is normal.  Abdominal:     Palpations: Abdomen is soft.     Tenderness: There is no abdominal tenderness.  Skin:    General: Skin is warm and dry.  Neurological:     General: No focal deficit present.     Mental Status: She is alert and oriented to person, place, and time.  Psychiatric:        Mood and Affect: Mood normal.  Behavior: Behavior normal.        Thought Content: Thought content normal.        Judgment: Judgment normal.  Cervix: 1.5/50/ballotable, vertex  NST: FHR: 145 bpm, moderate variability, +15x15 accels, no decels Toco: occasional uc  MAU Course  Procedures  MDM Reviewed patient's chart, patient meets criteria for gHTN given 2 elevated BP's >4 hrs apart. Patient asymptomatic Serial BP's; consistently 130s-150s/90s while in MAU CBC, CMP, UPCR NST reactive and reassuring  Assessment and Plan  [redacted] weeks gestation of pregnancy Gestational Hypertension   - Discussed with Dr. Vincente Poli patient's status and patient now meeting criteria for gHTN - Admit to L&D for IOL    Brand Males, CNM 06/11/2021, 2:52 PM

## 2021-06-11 NOTE — H&P (Signed)
Samantha Rogers is a 35 year old G3 P 2 at 4 week admitted for IOL secondary to PIH BP elevated in office, at home and in MAU. Platelets 142,000 OB History     Gravida  3   Para  2   Term  2   Preterm      AB      Living  2      SAB      IAB      Ectopic      Multiple  0   Live Births  2          Past Medical History:  Diagnosis Date   Pregnancy induced hypertension    Past Surgical History:  Procedure Laterality Date   BREAST ENHANCEMENT SURGERY Bilateral 2020   BUNIONECTOMY     TONSILLECTOMY     WISDOM TOOTH EXTRACTION     Family History: family history includes Bipolar disorder in her mother; Heart disease in her maternal grandmother; Hypertension in her father; Stroke in her maternal grandfather and paternal grandfather. Social History:  reports that she has never smoked. She has never used smokeless tobacco. She reports that she does not drink alcohol and does not use drugs.     Maternal Diabetes: No Genetic Screening: Normal Maternal Ultrasounds/Referrals: Normal Fetal Ultrasounds or other Referrals:  None Maternal Substance Abuse:  No Significant Maternal Medications:  None Significant Maternal Lab Results:  Group B Strep negative Other Comments:  None  Review of Systems  All other systems reviewed and are negative. History Dilation: 1.5 Effacement (%): 50 Station: Ballotable Exam by:: Camelia Eng, CNM Blood pressure (!) 146/84, pulse 79, temperature 98.9 F (37.2 C), temperature source Oral, resp. rate 15, SpO2 97 %, unknown if currently breastfeeding.   Fetal Exam Fetal State Assessment: Category I - tracings are normal.  Physical Exam Vitals and nursing note reviewed. Exam conducted with a chaperone present.  HENT:     Right Ear: Tympanic membrane normal.     Mouth/Throat:     Mouth: Mucous membranes are moist.  Eyes:     Pupils: Pupils are equal, round, and reactive to light.  Cardiovascular:     Rate and Rhythm: Normal  rate and regular rhythm.  Neurological:     Mental Status: She is alert.    Prenatal labs: ABO, Rh: --/--/PENDING (11/09 1454) Antibody: PENDING (11/09 1454) Rubella:   RPR:    HBsAg:    HIV:    GBS:     Assessment/Plan: IUP at term PIH  Cytotec IOL then pitocin Monitor labor curve and BPs closely   Jeani Hawking 06/11/2021, 3:54 PM

## 2021-06-12 ENCOUNTER — Encounter (HOSPITAL_COMMUNITY): Payer: Self-pay | Admitting: Obstetrics and Gynecology

## 2021-06-12 ENCOUNTER — Inpatient Hospital Stay (HOSPITAL_COMMUNITY): Payer: PRIVATE HEALTH INSURANCE | Admitting: Anesthesiology

## 2021-06-12 LAB — RPR: RPR Ser Ql: NONREACTIVE

## 2021-06-12 LAB — CBC
HCT: 39.4 % (ref 36.0–46.0)
Hemoglobin: 13.6 g/dL (ref 12.0–15.0)
MCH: 32 pg (ref 26.0–34.0)
MCHC: 34.5 g/dL (ref 30.0–36.0)
MCV: 92.7 fL (ref 80.0–100.0)
Platelets: 142 10*3/uL — ABNORMAL LOW (ref 150–400)
RBC: 4.25 MIL/uL (ref 3.87–5.11)
RDW: 12.3 % (ref 11.5–15.5)
WBC: 7.9 10*3/uL (ref 4.0–10.5)
nRBC: 0 % (ref 0.0–0.2)

## 2021-06-12 LAB — PLATELET COUNT: Platelets: 142 10*3/uL — ABNORMAL LOW (ref 150–400)

## 2021-06-12 MED ORDER — PRENATAL MULTIVITAMIN CH
1.0000 | ORAL_TABLET | Freq: Every day | ORAL | Status: DC
  Administered 2021-06-13: 1 via ORAL

## 2021-06-12 MED ORDER — WITCH HAZEL-GLYCERIN EX PADS: 1.0000 "application " | MEDICATED_PAD | CUTANEOUS | Status: DC | PRN

## 2021-06-12 MED ORDER — OXYTOCIN-SODIUM CHLORIDE 30-0.9 UT/500ML-% IV SOLN
1.0000 m[IU]/min | INTRAVENOUS | Status: DC
  Administered 2021-06-12: 2 m[IU]/min via INTRAVENOUS
  Filled 2021-06-12: qty 500

## 2021-06-12 MED ORDER — SENNOSIDES-DOCUSATE SODIUM 8.6-50 MG PO TABS
2.0000 | ORAL_TABLET | ORAL | Status: DC
  Administered 2021-06-13: 2 via ORAL

## 2021-06-12 MED ORDER — FENTANYL-BUPIVACAINE-NACL 0.5-0.125-0.9 MG/250ML-% EP SOLN
12.0000 mL/h | EPIDURAL | Status: DC | PRN
  Filled 2021-06-12: qty 250

## 2021-06-12 MED ORDER — IBUPROFEN 600 MG PO TABS: 600.0000 mg | ORAL_TABLET | Freq: Four times a day (QID) | ORAL | Status: DC

## 2021-06-12 MED ORDER — BENZOCAINE-MENTHOL 20-0.5 % EX AERO: 1.0000 "application " | INHALATION_SPRAY | CUTANEOUS | Status: DC | PRN

## 2021-06-12 MED ORDER — ACETAMINOPHEN 325 MG PO TABS
650.0000 mg | ORAL_TABLET | ORAL | Status: DC | PRN
  Administered 2021-06-12 – 2021-06-13 (×2): 650 mg via ORAL
  Filled 2021-06-12 (×2): qty 2

## 2021-06-12 MED ORDER — EPHEDRINE 5 MG/ML INJ: 10.0000 mg | INTRAVENOUS | Status: DC | PRN

## 2021-06-12 MED ORDER — SIMETHICONE 80 MG PO CHEW: 80.0000 mg | CHEWABLE_TABLET | ORAL | Status: DC | PRN

## 2021-06-12 MED ORDER — OXYCODONE HCL 5 MG PO TABS: 10.0000 mg | ORAL_TABLET | ORAL | Status: DC | PRN

## 2021-06-12 MED ORDER — DIPHENHYDRAMINE HCL 25 MG PO CAPS: 25.0000 mg | ORAL_CAPSULE | Freq: Four times a day (QID) | ORAL | Status: DC | PRN

## 2021-06-12 MED ORDER — DIBUCAINE (PERIANAL) 1 % EX OINT: 1.0000 "application " | TOPICAL_OINTMENT | CUTANEOUS | Status: DC | PRN

## 2021-06-12 MED ORDER — IBUPROFEN 600 MG PO TABS
600.0000 mg | ORAL_TABLET | Freq: Four times a day (QID) | ORAL | Status: DC
  Administered 2021-06-12 – 2021-06-14 (×6): 600 mg via ORAL
  Filled 2021-06-12 (×5): qty 1

## 2021-06-12 MED ORDER — ZOLPIDEM TARTRATE 5 MG PO TABS: 5.0000 mg | ORAL_TABLET | Freq: Every evening | ORAL | Status: DC | PRN

## 2021-06-12 MED ORDER — LACTATED RINGERS IV SOLN
500.0000 mL | Freq: Once | INTRAVENOUS | Status: AC
  Administered 2021-06-12: 500 mL via INTRAVENOUS

## 2021-06-12 MED ORDER — COCONUT OIL OIL: 1.0000 "application " | TOPICAL_OIL | Status: DC | PRN

## 2021-06-12 MED ORDER — TETANUS-DIPHTH-ACELL PERTUSSIS 5-2.5-18.5 LF-MCG/0.5 IM SUSY: 0.5000 mL | PREFILLED_SYRINGE | Freq: Once | INTRAMUSCULAR | Status: DC

## 2021-06-12 MED ORDER — ONDANSETRON HCL 4 MG PO TABS: 4.0000 mg | ORAL_TABLET | ORAL | Status: DC | PRN

## 2021-06-12 MED ORDER — LIDOCAINE HCL (PF) 1 % IJ SOLN
INTRAMUSCULAR | Status: DC | PRN
  Administered 2021-06-12: 10 mL via EPIDURAL
  Administered 2021-06-12: 2 mL via EPIDURAL

## 2021-06-12 MED ORDER — DIPHENHYDRAMINE HCL 50 MG/ML IJ SOLN: 12.5000 mg | INTRAMUSCULAR | Status: DC | PRN

## 2021-06-12 MED ORDER — PHENYLEPHRINE 40 MCG/ML (10ML) SYRINGE FOR IV PUSH (FOR BLOOD PRESSURE SUPPORT): 80.0000 ug | PREFILLED_SYRINGE | INTRAVENOUS | Status: DC | PRN

## 2021-06-12 MED ORDER — FENTANYL-BUPIVACAINE-NACL 0.5-0.125-0.9 MG/250ML-% EP SOLN
EPIDURAL | Status: DC | PRN
  Administered 2021-06-12: 12 mL/h via EPIDURAL

## 2021-06-12 MED ORDER — IBUPROFEN 600 MG PO TABS
600.0000 mg | ORAL_TABLET | Freq: Four times a day (QID) | ORAL | Status: DC
  Administered 2021-06-12: 600 mg via ORAL
  Filled 2021-06-12: qty 1

## 2021-06-12 MED ORDER — OXYCODONE HCL 5 MG PO TABS: 5.0000 mg | ORAL_TABLET | ORAL | Status: DC | PRN

## 2021-06-12 MED ORDER — ONDANSETRON HCL 4 MG/2ML IJ SOLN: 4.0000 mg | INTRAMUSCULAR | Status: DC | PRN

## 2021-06-12 NOTE — Progress Notes (Signed)
Delivery Note At 10:27 AM a viable female was delivered via  (Presentation:   LOA   ).  APGAR: , ; weight  .   Placenta status:  intact,  .  Cord:  3 vessels with the following complications:  none.  Cord pH:  AROM for clear fluid. IUPC placed. Rapid progress to C&P. One push to SVD. Anesthesia: Epidural Episiotomy:   Lacerations:  none Suture Repair:  Est. Blood Loss (mL):  200  Mom to postpartum.  Baby to Couplet care / Skin to Skin.  Samantha Rogers 06/12/2021, 10:35 AM

## 2021-06-12 NOTE — Progress Notes (Signed)
No HA or vision change One severe range BP that resolved and has not required treatment S/P cytotec x 4  FHT cat one Cx 3-4/70/-2/vtx/ballotable UCs q2-4 min  Will start pitocin per protocol and AROM when vtx better applied to cx D/W patient

## 2021-06-12 NOTE — Lactation Note (Signed)
This note was copied from a baby's chart. Lactation Consultation Note  Patient Name: Samantha Rogers [id number] Date: 06/12/2021 Reason for consult: L&D Initial assessment Age:34 hours  P3, Ex BF. Assisted with latching baby.  Mother has good positioning.  Lactation to follow up on MBU.   Maternal Data Does the patient have breastfeeding experience prior to this delivery?: Yes How long did the patient breastfeed?: one year  Feeding Mother's Current Feeding Choice: Breast Milk  LATCH Score Latch: Repeated attempts needed to sustain latch, nipple held in mouth throughout feeding, stimulation needed to elicit sucking reflex.  Audible Swallowing: A few with stimulation  Type of Nipple: Everted at rest and after stimulation  Comfort (Breast/Nipple): Soft / non-tender  Hold (Positioning): Assistance needed to correctly position infant at breast and maintain latch.  LATCH Score: 7    Interventions Interventions: Assisted with latch;Skin to skin;Education   Consult Status Consult Status: Follow-up from L&D    Dahlia Byes [facility] 06/12/2021, 11:19 AM

## 2021-06-12 NOTE — Anesthesia Procedure Notes (Signed)
Epidural Patient location during procedure: OB Start time: 06/12/2021 3:26 AM End time: 06/12/2021 3:34 AM  Staffing Anesthesiologist: Lannie Fields, DO Performed: anesthesiologist   Preanesthetic Checklist Completed: patient identified, IV checked, risks and benefits discussed, monitors and equipment checked, pre-op evaluation and timeout performed  Epidural Patient position: sitting Prep: DuraPrep and site prepped and draped Patient monitoring: continuous pulse ox, blood pressure, heart rate and cardiac monitor Approach: midline Location: L3-L4 Injection technique: LOR air  Needle:  Needle type: Tuohy  Needle gauge: 17 G Needle length: 9 cm Needle insertion depth: 5 cm Catheter type: closed end flexible Catheter size: 19 Gauge Catheter at skin depth: 10 cm Test dose: negative  Assessment Sensory level: T8 Events: blood not aspirated, injection not painful, no injection resistance, no paresthesia and negative IV test  Additional Notes Patient identified. Risks/Benefits/Options discussed with patient including but not limited to bleeding, infection, nerve damage, paralysis, failed block, incomplete pain control, headache, blood pressure changes, nausea, vomiting, reactions to medication both or allergic, itching and postpartum back pain. Confirmed with bedside nurse the patient's most recent platelet count. Confirmed with patient that they are not currently taking any anticoagulation, have any bleeding history or any family history of bleeding disorders. Patient expressed understanding and wished to proceed. All questions were answered. Sterile technique was used throughout the entire procedure. Please see nursing notes for vital signs. Test dose was given through epidural catheter and negative prior to continuing to dose epidural or start infusion. Warning signs of high block given to the patient including shortness of breath, tingling/numbness in hands, complete motor  block, or any concerning symptoms with instructions to call for help. Patient was given instructions on fall risk and not to get out of bed. All questions and concerns addressed with instructions to call with any issues or inadequate analgesia.  Reason for block:procedure for pain

## 2021-06-12 NOTE — Anesthesia Preprocedure Evaluation (Signed)
Anesthesia Evaluation  Patient identified by MRN, date of birth, ID band Patient awake    Reviewed: Allergy & Precautions, Patient's Chart, lab work & pertinent test results  Airway Mallampati: II  TM Distance: >3 FB Neck ROM: Full    Dental no notable dental hx.    Pulmonary neg pulmonary ROS,    Pulmonary exam normal breath sounds clear to auscultation       Cardiovascular hypertension (PIH), Normal cardiovascular exam Rhythm:Regular Rate:Normal     Neuro/Psych negative neurological ROS  negative psych ROS   GI/Hepatic negative GI ROS, Neg liver ROS,   Endo/Other  negative endocrine ROS  Renal/GU negative Renal ROS  negative genitourinary   Musculoskeletal negative musculoskeletal ROS (+)   Abdominal   Peds negative pediatric ROS (+)  Hematology negative hematology ROS (+) hct 38.2, plt 143   Anesthesia Other Findings   Reproductive/Obstetrics (+) Pregnancy                             Anesthesia Physical Anesthesia Plan  ASA: 2  Anesthesia Plan: Epidural   Post-op Pain Management:    Induction:   PONV Risk Score and Plan: 2  Airway Management Planned: Natural Airway  Additional Equipment: None  Intra-op Plan:   Post-operative Plan:   Informed Consent: I have reviewed the patients History and Physical, chart, labs and discussed the procedure including the risks, benefits and alternatives for the proposed anesthesia with the patient or authorized representative who has indicated his/her understanding and acceptance.       Plan Discussed with:   Anesthesia Plan Comments:         Anesthesia Quick Evaluation

## 2021-06-13 LAB — COMPREHENSIVE METABOLIC PANEL
ALT: 15 U/L (ref 0–44)
AST: 23 U/L (ref 15–41)
Albumin: 2.2 g/dL — ABNORMAL LOW (ref 3.5–5.0)
Alkaline Phosphatase: 123 U/L (ref 38–126)
Anion gap: 8 (ref 5–15)
BUN: 17 mg/dL (ref 6–20)
CO2: 22 mmol/L (ref 22–32)
Calcium: 8.6 mg/dL — ABNORMAL LOW (ref 8.9–10.3)
Chloride: 106 mmol/L (ref 98–111)
Creatinine, Ser: 0.92 mg/dL (ref 0.44–1.00)
GFR, Estimated: >60 mL/min (ref >60)
Glucose, Bld: 76 mg/dL (ref 70–99)
Potassium: 4.7 mmol/L (ref 3.5–5.1)
Sodium: 136 mmol/L (ref 135–145)
Total Bilirubin: 0.3 mg/dL (ref 0.3–1.2)
Total Protein: 4.6 g/dL — ABNORMAL LOW (ref 6.5–8.1)

## 2021-06-13 LAB — CBC
HCT: 39.2 % (ref 36.0–46.0)
Hemoglobin: 13.4 g/dL (ref 12.0–15.0)
MCH: 32 pg (ref 26.0–34.0)
MCHC: 34.2 g/dL (ref 30.0–36.0)
MCV: 93.6 fL (ref 80.0–100.0)
Platelets: 116 10*3/uL — ABNORMAL LOW (ref 150–400)
RBC: 4.19 MIL/uL (ref 3.87–5.11)
RDW: 12.6 % (ref 11.5–15.5)
WBC: 8 10*3/uL (ref 4.0–10.5)
nRBC: 0 % (ref 0.0–0.2)

## 2021-06-13 MED ORDER — LABETALOL HCL 200 MG PO TABS
200.0000 mg | ORAL_TABLET | Freq: Two times a day (BID) | ORAL | Status: DC
  Administered 2021-06-13 – 2021-06-14 (×2): 200 mg via ORAL
  Filled 2021-06-13 (×2): qty 1

## 2021-06-13 MED ORDER — LABETALOL HCL 100 MG PO TABS
100.0000 mg | ORAL_TABLET | Freq: Two times a day (BID) | ORAL | Status: DC
  Administered 2021-06-13: 100 mg via ORAL
  Filled 2021-06-13: qty 1

## 2021-06-13 NOTE — Progress Notes (Signed)
Post Partum Day 1 Subjective: no complaints, up ad lib, voiding, and tolerating PO  Objective: Blood pressure (!) 149/94, pulse 60, temperature 97.7 F (36.5 C), temperature source Axillary, resp. rate 16, height 5\' 5"  (1.651 m), weight 83 kg, SpO2 98 %, unknown if currently breastfeeding.  Physical Exam:  General: alert, cooperative, appears stated age, and no distress Lochia: appropriate Uterine Fundus: firm Incision:   DVT Evaluation: No evidence of DVT seen on physical exam.  Recent Labs    06/12/21 0121 06/13/21 0501  HGB 13.6 13.4  HCT 39.4 39.2    Assessment/Plan: Plan for discharge tomorrow and Circumcision prior to discharge Will increase labetalol to 200mg  bid No severe sxs.  Normal labs   LOS: 2 days   13/11/22 06/13/2021, 10:00 AM

## 2021-06-13 NOTE — Anesthesia Postprocedure Evaluation (Signed)
Anesthesia Post Note  Patient: Academic librarian  Procedure(s) Performed: AN AD HOC LABOR EPIDURAL     Patient location during evaluation: Mother Baby Anesthesia Type: Epidural Level of consciousness: awake and alert Pain management: pain level controlled Vital Signs Assessment: post-procedure vital signs reviewed and stable Respiratory status: spontaneous breathing, nonlabored ventilation and respiratory function stable Cardiovascular status: stable Postop Assessment: no headache, no backache, epidural receding, no apparent nausea or vomiting, patient able to bend at knees, adequate PO intake and able to ambulate Anesthetic complications: no   No notable events documented.  Last Vitals:  Vitals:   06/13/21 0603 06/13/21 0615  BP: (!) 145/97 (!) 149/94  Pulse:    Resp:    Temp:    SpO2:      Last Pain:  Vitals:   06/13/21 0559  TempSrc: Axillary  PainSc:    Pain Goal:                   Laban Emperor

## 2021-06-14 MED ORDER — LABETALOL HCL 200 MG PO TABS
200.0000 mg | ORAL_TABLET | Freq: Two times a day (BID) | ORAL | 0 refills | Status: AC
Start: 2021-06-14 — End: ?

## 2021-06-14 NOTE — Discharge Summary (Signed)
Postpartum Discharge Summary       Patient Name: Samantha Rogers DOB: 08-21-1985 MRN: [id number]  Date of admission: 06/11/2021 Delivery date:06/12/2021  Delivering provider: Everlene Farrier  Date of discharge: 06/14/2021  Admitting diagnosis: Normal labor and delivery [O80] Indication for care in labor or delivery [O75.9] Intrauterine pregnancy: [redacted]w[redacted]d     Secondary diagnosis:  Active Problems:   Normal labor and delivery   Indication for care in labor or delivery  Additional problems:      Discharge diagnosis: Term Pregnancy Delivered and Gestational Hypertension                                              Post partum procedures:   Augmentation: AROM, Pitocin, and Cytotec Complications: None  Hospital course: Induction of Labor With Vaginal Delivery   35 y.o. yo G3P3003 at [redacted]w[redacted]d was admitted to the hospital 06/11/2021 for induction of labor.  Indication for induction: Gestational hypertension.  Patient had an uncomplicated labor course as follows: Membrane Rupture Time/Date: 9:57 AM ,06/12/2021   Delivery Method:Vaginal, Spontaneous  Episiotomy: None  Lacerations:  None  Details of delivery can be found in separate delivery note.  Patient had a routine postpartum course. Patient is discharged home 06/14/21.  Newborn Data: Birth date:06/12/2021  Birth time:10:27 AM  Gender:Female  Living status:Living  Apgars:9 ,9  Weight:3062 g   Magnesium Sulfate received: No BMZ received: No Rhophylac:N/A MMR:N/A T-DaP:Given prenatally Flu: N/A Transfusion:No  Physical exam  Vitals:   06/13/21 2050 06/14/21 0603 06/14/21 0650 06/14/21 0833  BP: 119/78 (!) 140/91 (!) 148/91 126/83  Pulse: (!) 59 67 66 66  Resp: $Remo'18 18  17  'tgqME$ Temp: 98.1 F (36.7 C) 98.3 F (36.8 C)    TempSrc: Oral Oral    SpO2: 97% 96%    Weight:      Height:       General: alert, cooperative, and no distress Lochia: appropriate Uterine Fundus: firm Incision: N/A DVT Evaluation: No evidence of DVT  seen on physical exam. Labs: Lab Results  Component Value Date   WBC 8.0 06/13/2021   HGB 13.4 06/13/2021   HCT 39.2 06/13/2021   MCV 93.6 06/13/2021   PLT 116 (L) 06/13/2021   CMP Latest Ref Rng & Units 06/13/2021  Glucose 70 - 99 mg/dL 76  BUN 6 - 20 mg/dL 17  Creatinine 0.44 - 1.00 mg/dL 0.92  Sodium 135 - 145 mmol/L 136  Potassium 3.5 - 5.1 mmol/L 4.7  Chloride 98 - 111 mmol/L 106  CO2 22 - 32 mmol/L 22  Calcium 8.9 - 10.3 mg/dL 8.6(L)  Total Protein 6.5 - 8.1 g/dL 4.6(L)  Total Bilirubin 0.3 - 1.2 mg/dL 0.3  Alkaline Phos 38 - 126 U/L 123  AST 15 - 41 U/L 23  ALT 0 - 44 U/L 15   Edinburgh Score: Edinburgh Postnatal Depression Scale Screening Tool 06/12/2021  I have been able to laugh and see the funny side of things. 0  I have looked forward with enjoyment to things. 0  I have blamed myself unnecessarily when things went wrong. 0  I have been anxious or worried for no good reason. 0  I have felt scared or panicky for no good reason. 0  Things have been getting on top of me. 0  I have been so unhappy that I have had difficulty sleeping. 0  I have felt sad or miserable. 0  I have been so unhappy that I have been crying. 0  The thought of harming myself has occurred to me. 0  Edinburgh Postnatal Depression Scale Total 0      After visit meds:  Allergies as of 06/14/2021       Reactions   Sulfa Antibiotics Swelling   eyes        Medication List     TAKE these medications    ibuprofen 600 MG tablet Commonly known as: ADVIL Take 1 tablet (600 mg total) by mouth every 6 (six) hours.   labetalol 200 MG tablet Commonly known as: NORMODYNE Take 1 tablet (200 mg total) by mouth 2 (two) times daily.   prenatal multivitamin Tabs tablet Take 1 tablet by mouth daily at 12 noon.         Discharge home in stable condition Infant Feeding: Breast Infant Disposition:home with mother Discharge instruction: per After Visit Summary and Postpartum  booklet. Activity: Advance as tolerated. Pelvic rest for 6 weeks.  Diet: routine diet Anticipated Birth Control: Unsure Postpartum Appointment:6 weeks Additional Postpartum F/U: BP check 1 week Future Appointments:No future appointments. Follow up Visit:      06/14/2021 Luz Lex, MD

## 2021-06-16 LAB — SURGICAL PATHOLOGY

## 2021-06-17 ENCOUNTER — Inpatient Hospital Stay (HOSPITAL_COMMUNITY): Payer: Self-pay

## 2021-06-17 ENCOUNTER — Inpatient Hospital Stay (HOSPITAL_COMMUNITY): Admission: AD | Admit: 2021-06-17 | Payer: Self-pay | Source: Home / Self Care | Admitting: Obstetrics and Gynecology

## 2021-06-24 ENCOUNTER — Telehealth (HOSPITAL_COMMUNITY): Payer: Self-pay | Admitting: *Deleted

## 2021-06-24 NOTE — Telephone Encounter (Signed)
Attempted hospital discharge follow-up call. Left message for patient to return RN call. Deforest Hoyles, RN, 06/24/21, [phone]
